# Patient Record
Sex: Male | Born: 1970 | Race: White | Hispanic: No | Marital: Single | Smoking: Never smoker
Health system: Southern US, Community
[De-identification: ages and names within clinical notes are randomized; demographics above are authoritative.]

## PROBLEM LIST (undated history)

## (undated) DIAGNOSIS — M51369 Other intervertebral disc degeneration, lumbar region without mention of lumbar back pain or lower extremity pain: Secondary | ICD-10-CM

## (undated) DIAGNOSIS — F419 Anxiety disorder, unspecified: Secondary | ICD-10-CM

## (undated) DIAGNOSIS — M5136 Other intervertebral disc degeneration, lumbar region: Secondary | ICD-10-CM

## (undated) DIAGNOSIS — D649 Anemia, unspecified: Secondary | ICD-10-CM

## (undated) DIAGNOSIS — M1990 Unspecified osteoarthritis, unspecified site: Secondary | ICD-10-CM

## (undated) DIAGNOSIS — F988 Other specified behavioral and emotional disorders with onset usually occurring in childhood and adolescence: Secondary | ICD-10-CM

## (undated) DIAGNOSIS — K219 Gastro-esophageal reflux disease without esophagitis: Secondary | ICD-10-CM

## (undated) DIAGNOSIS — J45909 Unspecified asthma, uncomplicated: Secondary | ICD-10-CM

## (undated) DIAGNOSIS — T7840XA Allergy, unspecified, initial encounter: Secondary | ICD-10-CM

## (undated) DIAGNOSIS — F32A Depression, unspecified: Secondary | ICD-10-CM

## (undated) HISTORY — DX: Other specified behavioral and emotional disorders with onset usually occurring in childhood and adolescence: F98.8

## (undated) HISTORY — DX: Other intervertebral disc degeneration, lumbar region: M51.36

## (undated) HISTORY — DX: Allergy, unspecified, initial encounter: T78.40XA

## (undated) HISTORY — DX: Anemia, unspecified: D64.9

## (undated) HISTORY — DX: Other intervertebral disc degeneration, lumbar region without mention of lumbar back pain or lower extremity pain: M51.369

## (undated) HISTORY — DX: Unspecified osteoarthritis, unspecified site: M19.90

## (undated) HISTORY — PX: TONSILLECTOMY AND ADENOIDECTOMY: SUR1326

## (undated) HISTORY — DX: Depression, unspecified: F32.A

## (undated) HISTORY — DX: Anxiety disorder, unspecified: F41.9

## (undated) HISTORY — DX: Gastro-esophageal reflux disease without esophagitis: K21.9

## (undated) HISTORY — PX: EYE SURGERY: SHX253

---

## 1998-09-01 HISTORY — PX: LASIK: SHX215

## 2015-06-05 ENCOUNTER — Encounter (INDEPENDENT_AMBULATORY_CARE_PROVIDER_SITE_OTHER): Payer: Self-pay

## 2015-09-26 ENCOUNTER — Encounter: Payer: Self-pay | Admitting: Family Medicine

## 2015-10-23 ENCOUNTER — Encounter: Payer: Self-pay | Admitting: Family Medicine

## 2015-11-19 ENCOUNTER — Ambulatory Visit (INDEPENDENT_AMBULATORY_CARE_PROVIDER_SITE_OTHER): Payer: BC Managed Care – PPO | Admitting: Family Medicine

## 2015-11-19 ENCOUNTER — Encounter: Payer: Self-pay | Admitting: Family Medicine

## 2015-11-19 VITALS — BP 107/63 | HR 69 | Temp 98.6°F | Resp 16 | Ht 68.0 in | Wt 197.7 lb

## 2015-11-19 DIAGNOSIS — R350 Frequency of micturition: Secondary | ICD-10-CM

## 2015-11-19 DIAGNOSIS — Z0000 Encounter for general adult medical examination without abnormal findings: Secondary | ICD-10-CM | POA: Insufficient documentation

## 2015-11-19 LAB — POCT GLYCOSYLATED HEMOGLOBIN (HGB A1C): Hemoglobin A1C: 5.6

## 2015-11-19 NOTE — Progress Notes (Signed)
Name: Harold Forbes   MRN: [id number]    DOB: April 10, 1971   Date:11/19/2015       Progress Note  Subjective  Chief Complaint  Chief Complaint  Patient presents with  . Annual Exam    CPE    HPI  Pt. Is here for a Complete Physical Exam. He reports being in good health, No chronic conditions. Takes Zantac 3-4 times a month for heartburn or acid reflux.    Past Medical History  Diagnosis Date  . ADD (attention deficit disorder)   . GERD (gastroesophageal reflux disease)     Past Surgical History  Procedure Laterality Date  . Lasik Bilateral 2000    Family History  Problem Relation Age of Onset  . Healthy Mother   . Lung cancer Father     Social History   Social History  . Marital Status: Single    Spouse Name: N/A  . Number of Children: N/A  . Years of Education: N/A   Occupational History  . Not on file.   Social History Main Topics  . Smoking status: Never Smoker   . Smokeless tobacco: Never Used  . Alcohol Use: 0.0 oz/week    0 Standard drinks or equivalent per week     Comment: occasional  . Drug Use: No  . Sexual Activity: Yes   Other Topics Concern  . Not on file   Social History Narrative  . No narrative on file     Current outpatient prescriptions:  .  meloxicam (MOBIC) 15 MG tablet, Take 15 mg by mouth as needed for pain., Disp: , Rfl:  .  omeprazole (PRILOSEC) 20 MG capsule, Take 20 mg by mouth as needed., Disp: , Rfl:   No Known Allergies   Review of Systems  Constitutional: Negative for fever, chills, weight loss and malaise/fatigue.  HENT: Negative for ear pain and sore throat.   Eyes: Negative for blurred vision.  Respiratory: Negative for shortness of breath.   Cardiovascular: Negative for chest pain and leg swelling.  Gastrointestinal: Negative for nausea, vomiting, blood in stool and melena.  Genitourinary: Negative for dysuria and hematuria.  Musculoskeletal: Positive for joint pain. Negative for myalgias and back pain.   Skin: Negative for rash.  Neurological: Negative for dizziness and headaches.  Psychiatric/Behavioral: Negative for depression. The patient is not nervous/anxious.     Objective  Filed Vitals:   11/19/15 1409  BP: 107/63  Pulse: 69  Temp: 98.6 F (37 C)  TempSrc: Oral  Resp: 16  Height: 5\' 8"  (1.727 m)  Weight: 197 lb 11.2 oz (89.676 kg)  SpO2: 97%    Physical Exam  Constitutional: He is oriented to person, place, and time and well-developed, well-nourished, and in no distress. Vital signs are normal.  HENT:  Right Ear: No drainage or swelling.  Left Ear: No drainage or swelling.  Mouth/Throat: No posterior oropharyngeal erythema.  R. Ear cerumen impaction. L. Ear normal canal and TM  Eyes: Conjunctivae are normal.  Cardiovascular: Normal rate, regular rhythm, S1 normal, S2 normal and normal heart sounds.   Pulmonary/Chest: Effort normal and breath sounds normal. He has no decreased breath sounds. He has no wheezes.  Abdominal: Soft. Bowel sounds are normal.  Genitourinary: Rectum normal and prostate normal. Prostate is not tender.  Musculoskeletal: He exhibits no edema.       Right ankle: He exhibits no swelling.       Left ankle: He exhibits no swelling.  Neurological: He is alert and oriented to  person, place, and time.  Skin: Skin is warm, dry and intact.  Psychiatric: Mood, memory, affect and judgment normal.  Nursing note and vitals reviewed.      Assessment & Plan  1. Annual physical exam Age appropriate laboratory evaluation obtained. - CBC with Differential - Comprehensive Metabolic Panel (CMET) - TSH - Vitamin D (25 hydroxy)  2. Frequent urination Rule out diabetes as a possible cause of frequent urination - POCT HgB A1C   Harold Forbes [facility] 11/19/2015 2:27 PM

## 2015-11-23 LAB — CBC WITH DIFFERENTIAL/PLATELET
Basophils Absolute: 0 10*3/uL (ref 0.0–0.2)
Basos: 1 %
EOS (ABSOLUTE): 0.6 10*3/uL — ABNORMAL HIGH (ref 0.0–0.4)
EOS: 7 %
HEMATOCRIT: 42.7 % (ref 37.5–51.0)
Hemoglobin: 14.2 g/dL (ref 12.6–17.7)
Immature Grans (Abs): 0 10*3/uL (ref 0.0–0.1)
Immature Granulocytes: 0 %
LYMPHS ABS: 1.7 10*3/uL (ref 0.7–3.1)
Lymphs: 22 %
MCH: 28.2 pg (ref 26.6–33.0)
MCHC: 33.3 g/dL (ref 31.5–35.7)
MCV: 85 fL (ref 79–97)
MONOCYTES: 10 %
Monocytes Absolute: 0.7 10*3/uL (ref 0.1–0.9)
NEUTROS ABS: 4.7 10*3/uL (ref 1.4–7.0)
Neutrophils: 60 %
Platelets: 300 10*3/uL (ref 150–379)
RBC: 5.03 x10E6/uL (ref 4.14–5.80)
RDW: 13.7 % (ref 12.3–15.4)
WBC: 7.7 10*3/uL (ref 3.4–10.8)

## 2015-11-23 LAB — COMPREHENSIVE METABOLIC PANEL
ALK PHOS: 58 IU/L (ref 39–117)
ALT: 37 IU/L (ref 0–44)
AST: 27 IU/L (ref 0–40)
Albumin/Globulin Ratio: 2.1 (ref 1.2–2.2)
Albumin: 4.5 g/dL (ref 3.5–5.5)
BILIRUBIN TOTAL: 0.9 mg/dL (ref 0.0–1.2)
BUN/Creatinine Ratio: 15 (ref 9–20)
BUN: 17 mg/dL (ref 6–24)
CO2: 25 mmol/L (ref 18–29)
Calcium: 9.2 mg/dL (ref 8.7–10.2)
Chloride: 101 mmol/L (ref 96–106)
Creatinine, Ser: 1.11 mg/dL (ref 0.76–1.27)
GFR calc Af Amer: 92 mL/min/{1.73_m2} (ref >59)
GFR, EST NON AFRICAN AMERICAN: 79 mL/min/{1.73_m2} (ref >59)
GLOBULIN, TOTAL: 2.1 g/dL (ref 1.5–4.5)
Glucose: 110 mg/dL — ABNORMAL HIGH (ref 65–99)
POTASSIUM: 4.6 mmol/L (ref 3.5–5.2)
Sodium: 139 mmol/L (ref 134–144)
Total Protein: 6.6 g/dL (ref 6.0–8.5)

## 2015-11-23 LAB — TSH: TSH: 1.46 u[IU]/mL (ref 0.450–4.500)

## 2015-11-23 LAB — VITAMIN D 25 HYDROXY (VIT D DEFICIENCY, FRACTURES): VIT D 25 HYDROXY: 34.1 ng/mL (ref 30.0–100.0)

## 2016-09-15 ENCOUNTER — Encounter: Payer: BC Managed Care – PPO | Admitting: Family Medicine

## 2016-11-12 ENCOUNTER — Encounter: Payer: BC Managed Care – PPO | Admitting: Family Medicine

## 2016-11-19 ENCOUNTER — Encounter: Payer: Self-pay | Admitting: Family Medicine

## 2016-11-19 ENCOUNTER — Ambulatory Visit (INDEPENDENT_AMBULATORY_CARE_PROVIDER_SITE_OTHER): Admitting: Family Medicine

## 2016-11-19 VITALS — BP 118/76 | HR 73 | Temp 98.2°F | Resp 16 | Ht 68.0 in | Wt 193.0 lb

## 2016-11-19 DIAGNOSIS — M778 Other enthesopathies, not elsewhere classified: Secondary | ICD-10-CM | POA: Diagnosis not present

## 2016-11-19 DIAGNOSIS — Z0000 Encounter for general adult medical examination without abnormal findings: Secondary | ICD-10-CM | POA: Diagnosis not present

## 2016-11-19 DIAGNOSIS — Z113 Encounter for screening for infections with a predominantly sexual mode of transmission: Secondary | ICD-10-CM

## 2016-11-19 LAB — COMPLETE METABOLIC PANEL WITH GFR
ALT: 27 U/L (ref 9–46)
AST: 31 U/L (ref 10–40)
Albumin: 4.3 g/dL (ref 3.6–5.1)
Alkaline Phosphatase: 51 U/L (ref 40–115)
BUN: 15 mg/dL (ref 7–25)
CO2: 26 mmol/L (ref 20–31)
Calcium: 9.2 mg/dL (ref 8.6–10.3)
Chloride: 103 mmol/L (ref 98–110)
Creat: 1.28 mg/dL (ref 0.60–1.35)
GFR, EST NON AFRICAN AMERICAN: 66 mL/min (ref >=60)
GFR, Est African American: 77 mL/min (ref >=60)
GLUCOSE: 89 mg/dL (ref 65–99)
POTASSIUM: 4.2 mmol/L (ref 3.5–5.3)
SODIUM: 139 mmol/L (ref 135–146)
TOTAL PROTEIN: 6.6 g/dL (ref 6.1–8.1)
Total Bilirubin: 1.4 mg/dL — ABNORMAL HIGH (ref 0.2–1.2)

## 2016-11-19 LAB — CBC WITH DIFFERENTIAL/PLATELET
BASOS ABS: 110 {cells}/uL (ref 0–200)
Basophils Relative: 1 %
EOS ABS: 550 {cells}/uL — AB (ref 15–500)
EOS PCT: 5 %
HCT: 43.9 % (ref 38.5–50.0)
Hemoglobin: 14.7 g/dL (ref 13.2–17.1)
LYMPHS ABS: 1650 {cells}/uL (ref 850–3900)
Lymphocytes Relative: 15 %
MCH: 28.8 pg (ref 27.0–33.0)
MCHC: 33.5 g/dL (ref 32.0–36.0)
MCV: 86.1 fL (ref 80.0–100.0)
MPV: 10.5 fL (ref 7.5–12.5)
Monocytes Absolute: 660 cells/uL (ref 200–950)
Monocytes Relative: 6 %
NEUTROS ABS: 8030 {cells}/uL — AB (ref 1500–7800)
NEUTROS PCT: 73 %
PLATELETS: 288 10*3/uL (ref 140–400)
RBC: 5.1 MIL/uL (ref 4.20–5.80)
RDW: 13.5 % (ref 11.0–15.0)
WBC: 11 10*3/uL — ABNORMAL HIGH (ref 3.8–10.8)

## 2016-11-19 LAB — LIPID PANEL
CHOL/HDL RATIO: 3.1 ratio (ref <5.0)
Cholesterol: 143 mg/dL (ref <200)
HDL: 46 mg/dL (ref >40)
LDL CALC: 80 mg/dL (ref <100)
Triglycerides: 83 mg/dL (ref <150)
VLDL: 17 mg/dL (ref <30)

## 2016-11-19 LAB — TSH: TSH: 0.9 mIU/L (ref 0.40–4.50)

## 2016-11-19 LAB — PSA: PSA: 0.4 ng/mL (ref <=4.0)

## 2016-11-19 MED ORDER — MELOXICAM 15 MG PO TABS: 15.0000 mg | ORAL_TABLET | ORAL | 2 refills | Status: DC | PRN

## 2016-11-19 NOTE — Progress Notes (Signed)
Name: Harold Forbes   MRN: [id number]    DOB: 22-Jul-1971   Date:11/19/2016       Progress Note  Subjective  Chief Complaint  Chief Complaint  Patient presents with  . Annual Exam    HPI  Pt. Presents for Annual Physical Exam.   Past Medical History:  Diagnosis Date  . ADD (attention deficit disorder)   . GERD (gastroesophageal reflux disease)     Past Surgical History:  Procedure Laterality Date  . LASIK Bilateral 2000    Family History  Problem Relation Age of Onset  . Healthy Mother   . Lung cancer Father     Social History   Social History  . Marital status: Single    Spouse name: N/A  . Number of children: N/A  . Years of education: N/A   Occupational History  . Not on file.   Social History Main Topics  . Smoking status: Never Smoker  . Smokeless tobacco: Never Used  . Alcohol use 0.0 oz/week     Comment: occasional  . Drug use: No  . Sexual activity: Yes   Other Topics Concern  . Not on file   Social History Narrative  . No narrative on file     Current Outpatient Prescriptions:  .  meloxicam (MOBIC) 15 MG tablet, Take 15 mg by mouth as needed for pain., Disp: , Rfl:  .  ranitidine (ZANTAC) 150 MG tablet, Take by mouth., Disp: , Rfl:   No Known Allergies   Review of Systems  Constitutional: Negative for chills, fever, malaise/fatigue and weight loss.  HENT: Negative for congestion, ear pain and sore throat.   Eyes: Negative for blurred vision and double vision.  Respiratory: Negative for cough, shortness of breath and stridor.   Cardiovascular: Negative for chest pain and leg swelling.  Gastrointestinal: Positive for heartburn (occasional heartburn). Negative for abdominal pain, blood in stool, nausea and vomiting.  Genitourinary: Negative for hematuria.  Musculoskeletal: Negative for back pain (occasionally) and neck pain.  Neurological: Negative for dizziness and headaches.  Psychiatric/Behavioral: Negative for depression. The  patient is not nervous/anxious and does not have insomnia.     Objective  Vitals:   11/19/16 1433  BP: 118/76  Pulse: 73  Resp: 16  Temp: 98.2 F (36.8 C)  TempSrc: Oral  SpO2: 96%  Weight: 193 lb (87.5 kg)  Height: 5\' 8"  (1.727 m)    Physical Exam  Constitutional: He is oriented to person, place, and time and well-developed, well-nourished, and in no distress.  HENT:  Head: Normocephalic and atraumatic.  Eyes: Pupils are equal, round, and reactive to light.  Neck: Normal range of motion. Neck supple.  Cardiovascular: Normal rate, regular rhythm and normal heart sounds.   No murmur heard. Pulmonary/Chest: Effort normal and breath sounds normal. He has no wheezes.  Abdominal: Soft. Bowel sounds are normal. There is no tenderness. There is no rebound.  Genitourinary: Prostate is not enlarged and not tender.  Musculoskeletal: He exhibits no edema.  Neurological: He is alert and oriented to person, place, and time.  Psychiatric: Mood, memory, affect and judgment normal.  Nursing note and vitals reviewed.       Assessment & Plan  1. Annual physical exam Obtain age-appropriate laboratory workup - CBC with Differential/Platelet - COMPLETE METABOLIC PANEL WITH GFR - Lipid panel - TSH - VITAMIN D 25 Hydroxy (Vit-D Deficiency, Fractures) - PSA  2. Screening for STDs (sexually transmitted diseases) He has no concerning symptoms and does not believe  that he had been exposed to STDs, wants to screen as part of his annual physical. We'll order appropriate laboratory testing - GC/Chlamydia Probe Amp - RPR - HIV antibody (with reflex) - HSV(herpes smplx)abs-1+2(IgG+IgM)-bld  3. Right elbow tendonitis [place] with strenuous activity involving the elbow, uses meloxicam as needed, refills provided - meloxicam (MOBIC) 15 MG tablet; Take 1 tablet (15 mg total) by mouth as needed for pain.  Dispense: 30 tablet; Refill: 2  Willadean Guyton [facility] 11/19/2016 2:44 PM

## 2016-11-20 LAB — RPR

## 2016-11-20 LAB — VITAMIN D 25 HYDROXY (VIT D DEFICIENCY, FRACTURES): VIT D 25 HYDROXY: 33 ng/mL (ref 30–100)

## 2016-11-20 LAB — GC/CHLAMYDIA PROBE AMP
CT PROBE, AMP APTIMA: NOT DETECTED
GC PROBE AMP APTIMA: NOT DETECTED

## 2016-11-22 LAB — HSV 1/2 AB (IGM), IFA W/RFLX TITER
HSV 1 IgM Screen: NEGATIVE
HSV 2 IgM Screen: NEGATIVE

## 2016-11-22 LAB — HIV ANTIBODY (ROUTINE TESTING W REFLEX): HIV: NONREACTIVE

## 2016-11-24 ENCOUNTER — Encounter: Payer: BC Managed Care – PPO | Admitting: Family Medicine

## 2016-12-05 ENCOUNTER — Ambulatory Visit
Admission: RE | Admit: 2016-12-05 | Discharge: 2016-12-05 | Disposition: A | Discharge status: Home / Self Care | Source: Ambulatory Visit | Attending: Family Medicine | Admitting: Family Medicine

## 2016-12-05 ENCOUNTER — Encounter: Payer: Self-pay | Admitting: Family Medicine

## 2016-12-05 ENCOUNTER — Ambulatory Visit (INDEPENDENT_AMBULATORY_CARE_PROVIDER_SITE_OTHER): Admitting: Family Medicine

## 2016-12-05 VITALS — BP 116/71 | HR 87 | Temp 99.2°F | Resp 16 | Ht 68.0 in | Wt 189.0 lb

## 2016-12-05 DIAGNOSIS — R109 Unspecified abdominal pain: Secondary | ICD-10-CM | POA: Diagnosis not present

## 2016-12-05 DIAGNOSIS — R319 Hematuria, unspecified: Secondary | ICD-10-CM | POA: Insufficient documentation

## 2016-12-05 HISTORY — DX: Unspecified asthma, uncomplicated: J45.909

## 2016-12-05 LAB — POCT URINALYSIS DIPSTICK
Bilirubin, UA: NEGATIVE
Glucose, UA: NEGATIVE
Ketones, UA: NEGATIVE
Leukocytes, UA: NEGATIVE
Nitrite, UA: POSITIVE
Protein, UA: 100
Spec Grav, UA: 1.02 (ref 1.030–1.035)
Urobilinogen, UA: 0.2 (ref ?–2.0)
pH, UA: 5 (ref 5.0–8.0)

## 2016-12-05 MED ORDER — CIPROFLOXACIN HCL 500 MG PO TABS: 500.0000 mg | ORAL_TABLET | Freq: Two times a day (BID) | ORAL | 0 refills | Status: AC

## 2016-12-05 MED ORDER — IOPAMIDOL (ISOVUE-300) INJECTION 61%
100.0000 mL | Freq: Once | INTRAVENOUS | Status: AC | PRN
  Administered 2016-12-05: 100 mL via INTRAVENOUS

## 2016-12-05 MED ORDER — TAMSULOSIN HCL 0.4 MG PO CAPS: 0.4000 mg | ORAL_CAPSULE | Freq: Every day | ORAL | 0 refills | Status: DC

## 2016-12-05 NOTE — Progress Notes (Signed)
Name: Harold Forbes   MRN: [id number]    DOB: 31-Oct-1970   Date:12/05/2016       Progress Note  Subjective  Chief Complaint  Chief Complaint  Patient presents with  . Hematuria    Hematuria  This is a new problem. The current episode started in the past 7 days (2 days ago). He describes the hematuria as gross hematuria. The hematuria occurs throughout @ entire urinary stream.  He reports clotting at the end of his urine stream. The pain is moderate. He describes his urine color as yellow (now clear urine with hints of red in it). Obstructive symptoms include dribbling. Obstructive symptoms do not include a slower stream or a weak stream. Associated symptoms include flank pain. There is no history of kidney stones.     Past Medical History:  Diagnosis Date  . ADD (attention deficit disorder)   . Asthma   . GERD (gastroesophageal reflux disease)     Past Surgical History:  Procedure Laterality Date  . LASIK Bilateral 2000    Family History  Problem Relation Age of Onset  . Healthy Mother   . Lung cancer Father     Social History   Social History  . Marital status: Single    Spouse name: N/A  . Number of children: N/A  . Years of education: N/A   Occupational History  . Not on file.   Social History Main Topics  . Smoking status: Never Smoker  . Smokeless tobacco: Never Used  . Alcohol use 0.0 oz/week     Comment: occasional  . Drug use: No  . Sexual activity: Yes   Other Topics Concern  . Not on file   Social History Narrative  . No narrative on file     Current Outpatient Prescriptions:  .  meloxicam (MOBIC) 15 MG tablet, Take 1 tablet (15 mg total) by mouth as needed for pain., Disp: 30 tablet, Rfl: 2 .  ranitidine (ZANTAC) 150 MG tablet, Take by mouth., Disp: , Rfl:  .  ciprofloxacin (CIPRO) 500 MG tablet, Take 1 tablet (500 mg total) by mouth 2 (two) times daily., Disp: 14 tablet, Rfl: 0 .  tamsulosin (FLOMAX) 0.4 MG CAPS capsule, Take 1 capsule  (0.4 mg total) by mouth daily after supper., Disp: 30 capsule, Rfl: 0  No Known Allergies   Review of Systems  Genitourinary: Positive for flank pain and hematuria.    Please see history of present illness for complete discussion of ROS  Objective  Vitals:   12/05/16 1432  BP: 116/71  Pulse: 87  Resp: 16  Temp: 99.2 F (37.3 C)  TempSrc: Oral  SpO2: 99%  Weight: 189 lb (85.7 kg)  Height:  (1.727 m)    Physical Exam  Constitutional: He is oriented to person, place, and time and well-developed, well-nourished, and in no distress.  Cardiovascular: Normal rate, regular rhythm and normal heart sounds.   No murmur heard. Pulmonary/Chest: Effort normal and breath sounds normal. He has no wheezes.  Abdominal: Soft. Bowel sounds are normal. There is no tenderness. There is no CVA tenderness.  Genitourinary: Rectum normal and prostate normal. Prostate is not enlarged and not tender.  Neurological: He is alert and oriented to person, place, and time.  Skin: Skin is warm, dry and intact.  Psychiatric: Mood, memory, affect and judgment normal.  Nursing note and vitals reviewed.      Assessment & Plan  1. Hematuria, unspecified type Suspect kidney stones, obtain CT abdomen and  pelvis with and without contrast for confirmation. - POCT Urinalysis Dipstick - CT ABDOMEN PELVIS W WO CONTRAST; Future  2. Acute left flank pain Would add ciprofloxacin to cover for possible infection given that his nitrites are positive, start on Flomax 0.4 mg to be taken for possible kidney stone, urinalysis and culture to be ordered. - ciprofloxacin (CIPRO) 500 MG tablet; Take 1 tablet (500 mg total) by mouth 2 (two) times daily.  Dispense: 14 tablet; Refill: 0 - tamsulosin (FLOMAX) 0.4 MG CAPS capsule; Take 1 capsule (0.4 mg total) by mouth daily after supper.  Dispense: 30 capsule; Refill: 0 - Urinalysis, Routine w reflex microscopic - Urine Culture   Harold Forbes [facility] 12/05/2016 5:43 PM

## 2016-12-06 LAB — URINALYSIS, MICROSCOPIC ONLY
CRYSTALS: NONE SEEN [HPF]
Casts: NONE SEEN [LPF]
Yeast: NONE SEEN [HPF]

## 2016-12-06 LAB — URINALYSIS, ROUTINE W REFLEX MICROSCOPIC
Bilirubin Urine: NEGATIVE
Glucose, UA: NEGATIVE
Nitrite: POSITIVE — AB
SPECIFIC GRAVITY, URINE: 1.026 (ref 1.001–1.035)
pH: 6 (ref 5.0–8.0)

## 2016-12-08 LAB — URINE CULTURE

## 2016-12-11 ENCOUNTER — Ambulatory Visit: Payer: Self-pay

## 2017-08-02 IMAGING — CT CT ABD-PEL WO/W CM
3 of 9 series · 12 of 46 positions shown, 18 images · IV contrast (iopamidol)
Comparison: None.

CLINICAL DATA: Left flank pain and hematuria for 2 days.

EXAM:
CT ABDOMEN AND PELVIS WITHOUT AND WITH CONTRAST
TECHNIQUE: Multidetector CT imaging of the abdomen and pelvis was performed
following the standard protocol before and following the bolus
administration of intravenous contrast.
CONTRAST:  100mL PPYN89-PQQ IOPAMIDOL (PPYN89-PQQ) INJECTION 61%

[Series 2: abd/pel pre · axial · non-contrast · 0.76mm/px · z∈[-471,-111]mm · 7 of 96 slices shown, 12 images]
[im 12/96  soft-tissue]
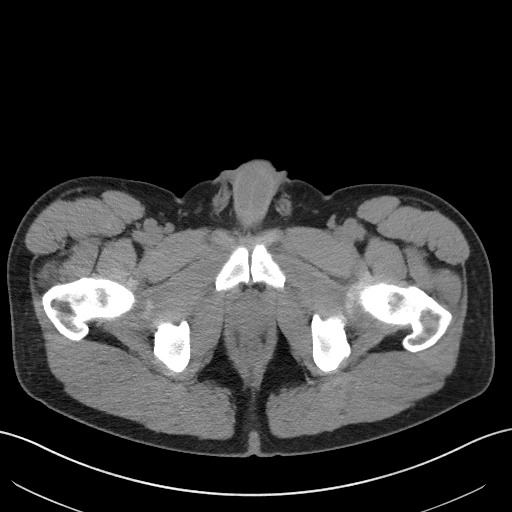
[im 12/96  bone]
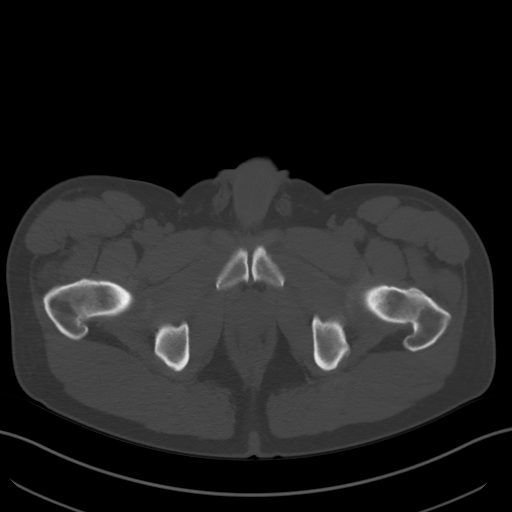
[im 24/96  soft-tissue]
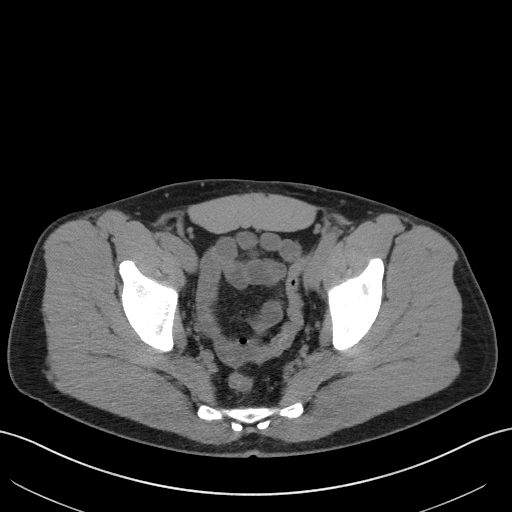
[im 36/96  soft-tissue]
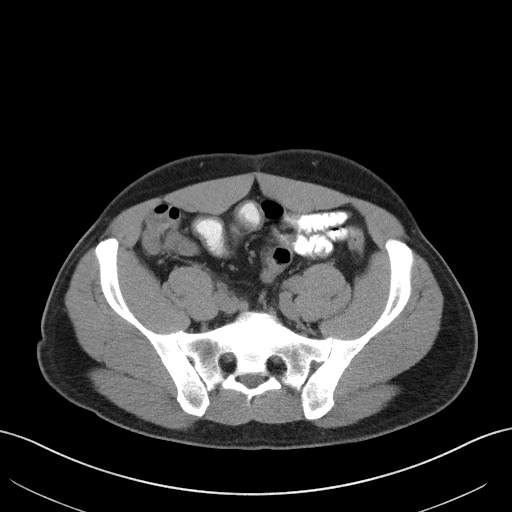
[im 48/96  soft-tissue]
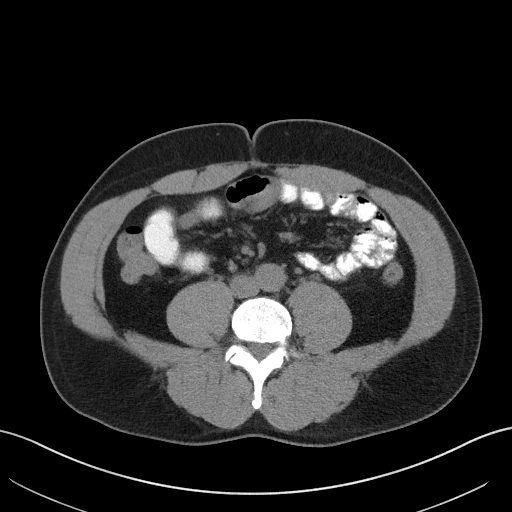
[im 48/96  lung]
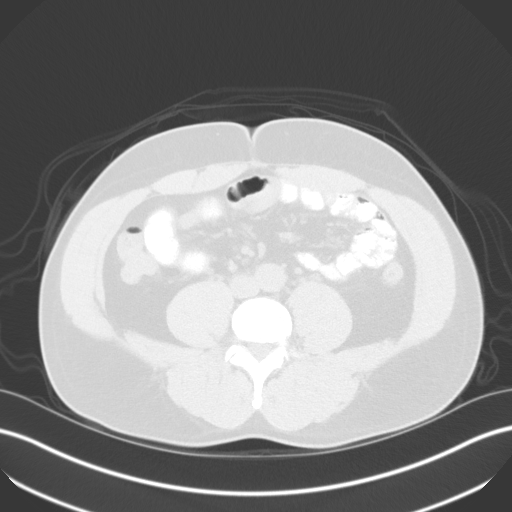
[im 60/96  soft-tissue]
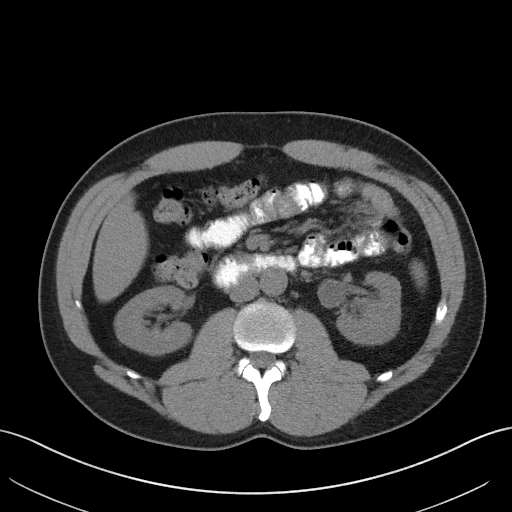
[im 60/96  lung]
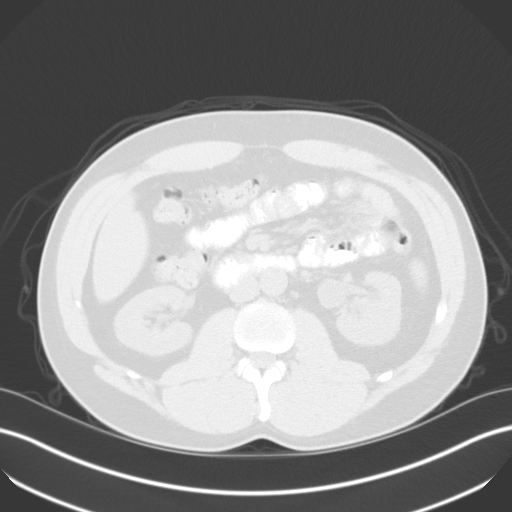
[im 72/96  soft-tissue]
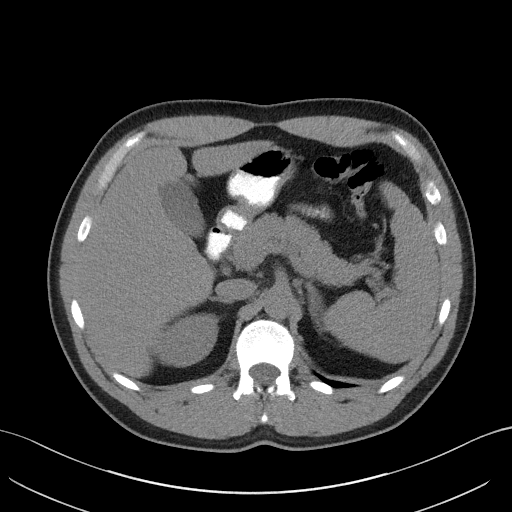
[im 72/96  lung]
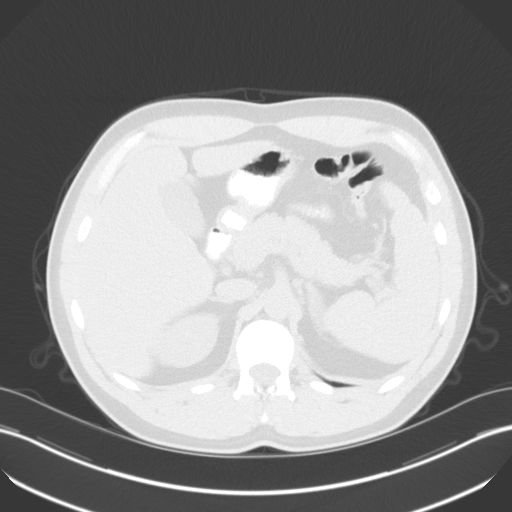
[im 84/96  soft-tissue]
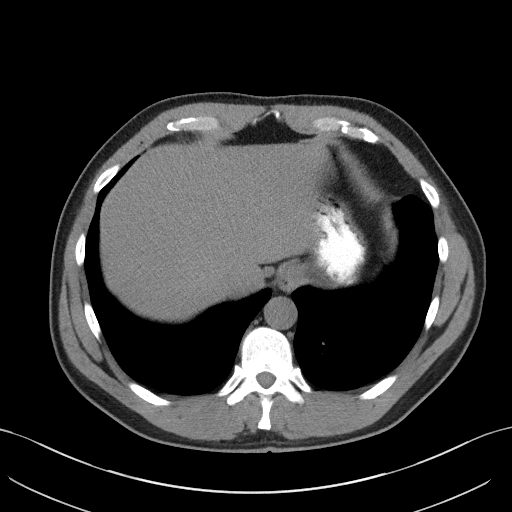
[im 84/96  lung]
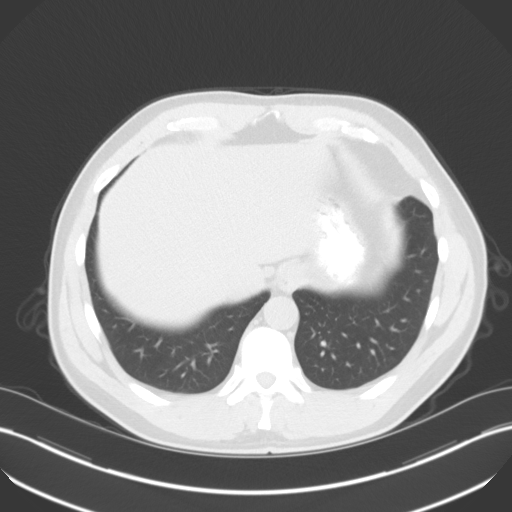

[Series 5: coronal st · coronal · 0.78mm/px · 3 of 92 slices shown, 4 images]
[im 23/92  soft-tissue]
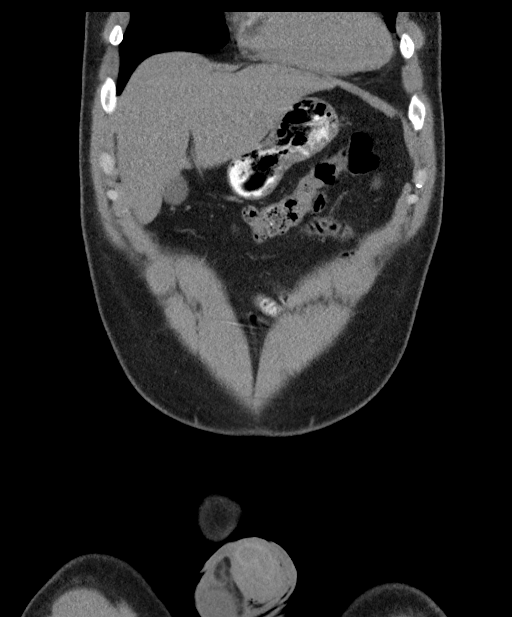
[im 46/92  soft-tissue]
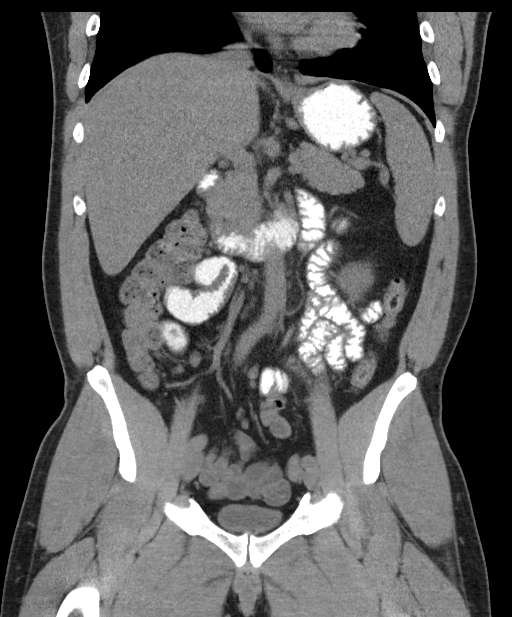
[im 46/92  bone]
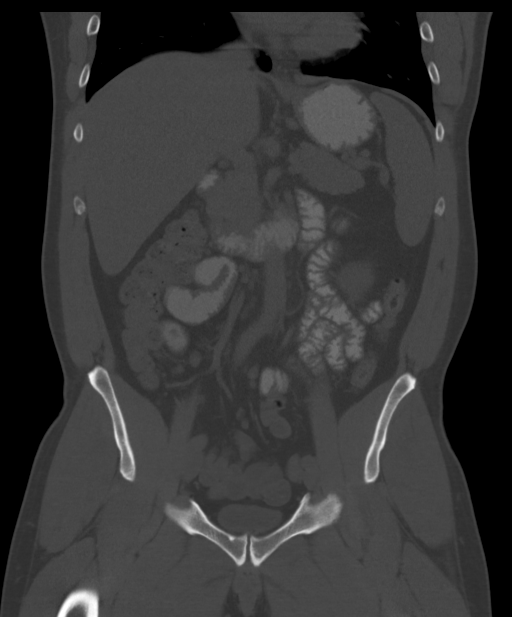
[im 69/92  soft-tissue]
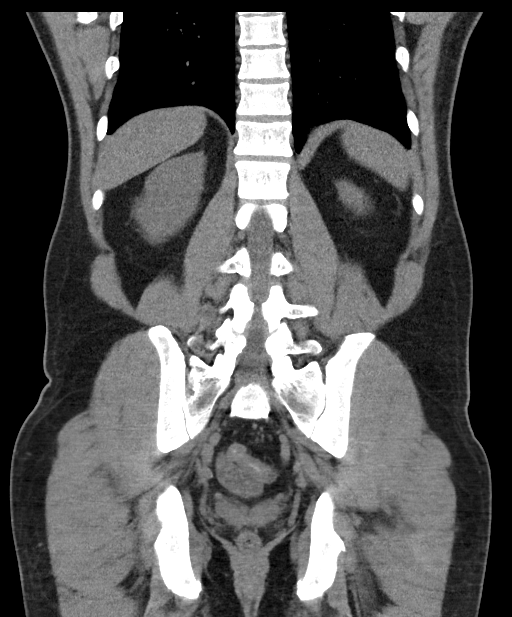

[Series 7: abd/pel post · axial · 0.76mm/px · z∈[-471,-411]mm · 2 of 96 slices shown]
[im 12/96  soft-tissue]
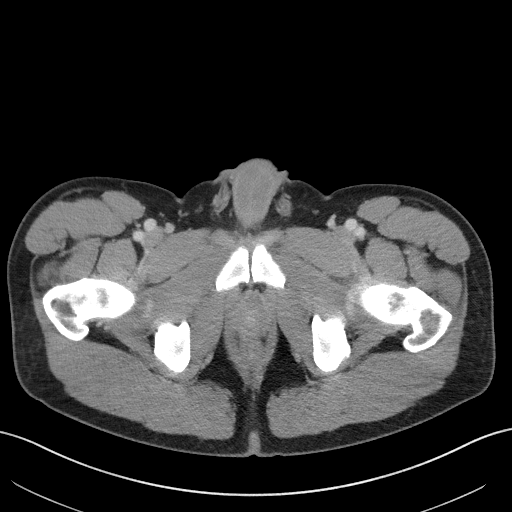
[im 24/96  soft-tissue]
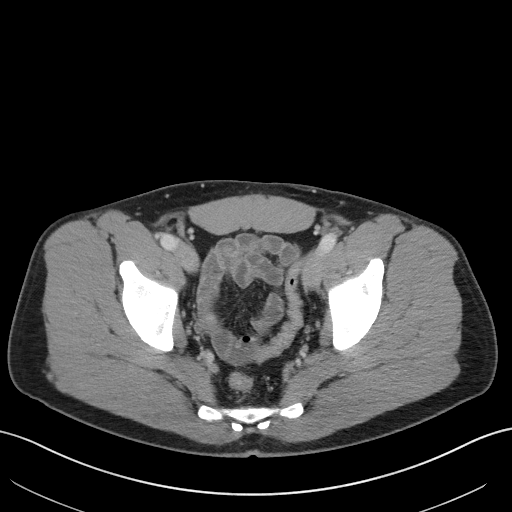

[12 of 46 positions shown; findings below may reference images not displayed]

FINDINGS: Lower chest: 0 The lung bases are clear of acute process. No pleural
effusion or pulmonary lesions. The heart is normal in size. No
pericardial effusion. The distal esophagus and aorta are
unremarkable.

Hepatobiliary: No focal hepatic lesions or intrahepatic biliary
dilatation. The gallbladder is normal. No common bile duct
dilatation.

Pancreas: No mass, inflammation or ductal dilatation.

Spleen: Normal size.  No focal lesions.

Adrenals/Urinary Tract: The adrenal glands are normal.

No renal, ureteral or bladder calculi or mass. Extrarenal pelvis
noted on the left. Duplicated collecting system noted on the right.

Stomach/Bowel: The stomach, duodenum, small bowel and colon are
unremarkable. No inflammatory changes, mass lesions or obstructive
findings. The terminal ileum is normal.

Vascular/Lymphatic: The aorta is normal in caliber. No dissection.
The branch vessels are patent. The major venous structures are
patent. No mesenteric or retroperitoneal mass or adenopathy. Small
scattered lymph nodes are noted.

Reproductive: The prostate gland and seminal vesicles are
unremarkable.

Other: No pelvic mass or adenopathy. No free pelvic fluid
collections. No inguinal mass or adenopathy. No abdominal wall
hernia or subcutaneous lesions.

Musculoskeletal: No significant bony findings.
IMPRESSION: No acute abdominal/ pelvic findings, mass lesions or
lymphadenopathy.

No renal, ureteral or bladder calculi or mass.

## 2017-12-08 ENCOUNTER — Encounter: Admitting: Family Medicine

## 2018-01-28 ENCOUNTER — Encounter: Admitting: Family Medicine

## 2018-01-29 ENCOUNTER — Ambulatory Visit (INDEPENDENT_AMBULATORY_CARE_PROVIDER_SITE_OTHER): Admitting: Family Medicine

## 2018-01-29 ENCOUNTER — Encounter: Payer: Self-pay | Admitting: Family Medicine

## 2018-01-29 VITALS — BP 116/78 | HR 85 | Temp 98.3°F | Resp 12 | Ht 68.0 in | Wt 195.8 lb

## 2018-01-29 DIAGNOSIS — M778 Other enthesopathies, not elsewhere classified: Secondary | ICD-10-CM

## 2018-01-29 DIAGNOSIS — K219 Gastro-esophageal reflux disease without esophagitis: Secondary | ICD-10-CM | POA: Insufficient documentation

## 2018-01-29 DIAGNOSIS — R0683 Snoring: Secondary | ICD-10-CM

## 2018-01-29 DIAGNOSIS — Z0000 Encounter for general adult medical examination without abnormal findings: Secondary | ICD-10-CM | POA: Diagnosis not present

## 2018-01-29 MED ORDER — RANITIDINE HCL 150 MG PO TABS: 150.0000 mg | ORAL_TABLET | Freq: Every day | ORAL | 3 refills | Status: DC | PRN

## 2018-01-29 MED ORDER — MELOXICAM 15 MG PO TABS: 15.0000 mg | ORAL_TABLET | Freq: Every day | ORAL | 3 refills | Status: DC | PRN

## 2018-01-29 NOTE — Progress Notes (Signed)
BP 116/78   Pulse 85   Temp 98.3 F (36.8 C) (Oral)   Resp 12   Ht 5\' 8"  (1.727 m)   Wt 195 lb 12.8 oz (88.8 kg)   SpO2 95%   BMI 29.77 kg/m    Subjective:    Patient ID: Harold Forbes, male    DOB: Nov 07, 1970, 47 y.o.   MRN: [id number]  HPI: Harold Forbes is a 47 y.o. male  Chief Complaint  Patient presents with  . Follow-up  . Medication Refill  . Gastroesophageal Reflux    HPI Patient is new to me; previous provider left our practice  GERD; bothered with that for a few years; triggers are acid foods, caffeine, tomatoes, spices, bananas; onions Okay with green and red peppers; avoids hot peppers No blood in the stool and no abd pain Father may have had a stomach ulcer  He takes the meloxicam; he works out and uses that for tendonitis; can feel things getting pulled when working; free weights; not an every day  Mildly elevated bilirubin, no symptoms at all  Allergies; sneezing at times; itchy watery eyes; sometimes  USPSTF grade A and B recommendations Depression:  Depression screen [facility] 2/9 01/29/2018 12/05/2016 11/19/2015  Decreased Interest 0 0 0  Down, Depressed, Hopeless 1 0 0  PHQ - 2 Score 1 0 0  Altered sleeping 1 - -  Tired, decreased energy 0 - -  Change in appetite 0 - -  Feeling bad or failure about yourself  0 - -  Trouble concentrating 0 - -  Moving slowly or fidgety/restless 0 - -  Suicidal thoughts 0 - -  PHQ-9 Score 2 - -  Difficult doing work/chores Not difficult at all - -   Hypertension: BP Readings from Last 3 Encounters:  01/29/18 116/78  12/05/16 116/71  11/19/16 118/76   Obesity: Wt Readings from Last 3 Encounters:  01/29/18 195 lb 12.8 oz (88.8 kg)  12/05/16 189 lb (85.7 kg)  11/19/16 193 lb (87.5 kg)   BMI Readings from Last 3 Encounters:  01/29/18 29.77 kg/m  12/05/16 28.74 kg/m  11/19/16 29.35 kg/m    Immunizations:  Skin cancer: nothing worrisome Lung cancer:  nonsmoker Prostate cancer: one uncle; getting up  at night to drink; sweeteners in ice will do it; no obstructive symptoms; does not want sleep apnea testing; does snore; stops breathing when having acid reflux; wakes himself up choking; heavy snorer on his back Lab Results  Component Value Date   PSA 0.4 02/03/2018   PSA 0.4 11/19/2016  Colorectal cancer: no one in the family; start at 50 years AAA: n/a Aspirin: yes, his choice Diet: mostly good eater; tries to eat healthy food, 3-5 veggies a day, 3-5 fruits Exercise: active Alcohol: social Tobacco use: no HIV, hep B, hep C: test STD testing and prevention (chl/gon/syphilis): test Glucose:  Glucose  Date Value Ref Range Status  11/22/2015 110 (H) 65 - 99 mg/dL Final   Glucose, Bld  Date Value Ref Range Status  02/03/2018 92 65 - 99 mg/dL Final    Comment:    .            Fasting reference interval .   11/19/2016 89 65 - 99 mg/dL Final   Lipids:  Lab Results  Component Value Date   CHOL 181 02/03/2018   CHOL 143 11/19/2016   Lab Results  Component Value Date   HDL 48 02/03/2018   HDL 46 11/19/2016   Lab Results  Component Value Date   LDLCALC 114 (H) 02/03/2018   LDLCALC 80 11/19/2016   Lab Results  Component Value Date   TRIG 89 02/03/2018   TRIG 83 11/19/2016   Lab Results  Component Value Date   CHOLHDL 3.8 02/03/2018   CHOLHDL 3.1 11/19/2016   No results found for: LDLDIRECT     Depression screen [facility] 2/9 01/29/2018 12/05/2016 11/19/2015  Decreased Interest 0 0 0  Down, Depressed, Hopeless 1 0 0  PHQ - 2 Score 1 0 0  Altered sleeping 1 - -  Tired, decreased energy 0 - -  Change in appetite 0 - -  Feeling bad or failure about yourself  0 - -  Trouble concentrating 0 - -  Moving slowly or fidgety/restless 0 - -  Suicidal thoughts 0 - -  PHQ-9 Score 2 - -  Difficult doing work/chores Not difficult at all - -  stressed, but not depressed  Relevant past medical, surgical, family and social history reviewed Past Medical History:  Diagnosis Date    . ADD (attention deficit disorder)   . Asthma   . GERD (gastroesophageal reflux disease)   MD note: not sure if he truly has ADD, lots to do, maybe sidetracked  Past Surgical History:  Procedure Laterality Date  . LASIK Bilateral 2000  . TONSILLECTOMY AND ADENOIDECTOMY     Family History  Problem Relation Age of Onset  . Healthy Mother   . Lung cancer Father   . Cancer Father 63       lung   Social History   Tobacco Use  . Smoking status: Never Smoker  . Smokeless tobacco: Never Used  Substance Use Topics  . Alcohol use: Yes    Alcohol/week: 0.0 oz    Comment: occasional  . Drug use: No   Interim medical history since last visit reviewed. Allergies and medications reviewed  Review of Systems  Constitutional: Negative for fever and unexpected weight change (taking protein supplement).  Respiratory: Positive for wheezing (occasional; asthma as a child; type of asthma that improved with age and after tonsillectomy; no more than 2x a week).   Cardiovascular: Negative for chest pain.  Gastrointestinal: Negative for blood in stool.  Endocrine: Negative for polydipsia.  Genitourinary: Negative for hematuria.  Musculoskeletal: Positive for arthralgias.  Skin:       No worrisome  Allergic/Immunologic: Negative for food allergies.  Neurological: Negative for tremors.  Psychiatric/Behavioral: Negative for dysphoric mood.   Per HPI unless specifically indicated above     Objective:    BP 116/78   Pulse 85   Temp 98.3 F (36.8 C) (Oral)   Resp 12   Ht 5\' 8"  (1.727 m)   Wt 195 lb 12.8 oz (88.8 kg)   SpO2 95%   BMI 29.77 kg/m   Wt Readings from Last 3 Encounters:  01/29/18 195 lb 12.8 oz (88.8 kg)  12/05/16 189 lb (85.7 kg)  11/19/16 193 lb (87.5 kg)    Physical Exam  Constitutional: He appears well-developed and well-nourished. No distress.  HENT:  Head: Normocephalic and atraumatic.  Nose: Nose normal.  Mouth/Throat: Oropharynx is clear and moist.  Eyes:  EOM are normal. No scleral icterus.  Neck: No JVD present. No thyromegaly present.  Cardiovascular: Normal rate, regular rhythm and normal heart sounds.  Pulmonary/Chest: Effort normal and breath sounds normal. No respiratory distress. He has no wheezes. He has no rales.  Abdominal: Soft. Bowel sounds are normal. He exhibits no distension. There is no  tenderness. There is no guarding.  Musculoskeletal: Normal range of motion. He exhibits no edema.  Lymphadenopathy:    He has no cervical adenopathy.  Neurological: He is alert. He displays normal reflexes. He exhibits normal muscle tone. Coordination normal.  Skin: Skin is warm and dry. No rash noted. He is not diaphoretic. No erythema. No pallor.  Psychiatric: He has a normal mood and affect. His behavior is normal. Judgment and thought content normal.      Assessment & Plan:   Problem List Items Addressed This Visit      Digestive   GERD (gastroesophageal reflux disease)    He is aware of triggers; careful with aspirin, NSAIDs, bleeding risk; continue H2 blocker      Relevant Medications   ranitidine (ZANTAC) 150 MG tablet     Musculoskeletal and Integument   Right elbow tendonitis   Relevant Medications   meloxicam (MOBIC) 15 MG tablet     Other   Preventative health care - Primary    USPSTF grade A and B recommendations reviewed with patient; age-appropriate recommendations, preventive care, screening tests, etc discussed and encouraged; healthy living encouraged; see AVS for patient education given to patient       Annual physical exam    USPSTF grade A and B recommendations reviewed with patient; age-appropriate recommendations, preventive care, screening tests, etc discussed and encouraged; healthy living encouraged; see AVS for patient education given to patient        Other Visit Diagnoses    Snoring       concern for possible OSA; refer to pulm   Relevant Orders   Ambulatory referral to Pulmonology        Follow up plan: Return in about 15 months (around 04/25/2019) for complete physical.  An after-visit summary was printed and given to the patient at check-out.  Please see the patient instructions which may contain other information and recommendations beyond what is mentioned above in the assessment and plan.  Meds ordered this encounter  Medications  . ranitidine (ZANTAC) 150 MG tablet    Sig: Take 1 tablet (150 mg total) by mouth daily as needed.    Dispense:  90 tablet    Refill:  3    Okay to fill early prior to trip if needed  . meloxicam (MOBIC) 15 MG tablet    Sig: Take 1 tablet (15 mg total) by mouth daily as needed for pain. Caution: prolonged use can be detrimental to the kidneys    Dispense:  90 tablet    Refill:  3    Orders Placed This Encounter  Procedures  . Ambulatory referral to Pulmonology

## 2018-01-29 NOTE — Assessment & Plan Note (Signed)
He is aware of triggers; careful with aspirin, NSAIDs, bleeding risk; continue H2 blocker

## 2018-01-29 NOTE — Patient Instructions (Addendum)
Return on another day fasting for labs If you have not heard anything from my staff in a week about any orders/referrals/studies from today, please contact us here to follow-up [phone] We'll have you see the specialist about possible sleep apnea Caution: prolonged use of proton pump inhibitors like omeprazole (Prilosec), pantoprazole (Protonix), esomeprazole (Nexium), and others like Dexilant and Aciphex may increase your risk of pneumonia, Clostridium difficile colitis, osteoporosis, anemia and other health complications Try to limit or avoid triggers like coffee, caffeinated beverages, onions, chocolate, spicy foods, peppermint, acidic foods like pizza, spaghetti sauce, and orange juice Lose weight if you are overweight or obese Try elevating the head of your bed by placing a small wedge between your mattress and box springs to keep acid in the stomach at night instead of coming up into your esophagus  Health Maintenance, Male A healthy lifestyle and preventive care is important for your health and wellness. Ask your health care provider about what schedule of regular examinations is right for you. What should I know about weight and diet? Eat a Healthy Diet  Eat plenty of vegetables, fruits, whole grains, low-fat dairy products, and lean protein.  Do not eat a lot of foods high in solid fats, added sugars, or salt.  Maintain a Healthy Weight Regular exercise can help you achieve or maintain a healthy weight. You should:  Do at least 150 minutes of exercise each week. The exercise should increase your heart rate and make you sweat (moderate-intensity exercise).  Do strength-training exercises at least twice a week.  Watch Your Levels of Cholesterol and Blood Lipids  Have your blood tested for lipids and cholesterol every 5 years starting at 49 years of age. If you are at high risk for heart disease, you should start having your blood tested when you are 49 years old. You may need  to have your cholesterol levels checked more often if: ? Your lipid or cholesterol levels are high. ? You are older than 49 years of age. ? You are at high risk for heart disease.  What should I know about cancer screening? Many types of cancers can be detected early and may often be prevented. Lung Cancer  You should be screened every year for lung cancer if: ? You are a current smoker who has smoked for at least 30 years. ? You are a former smoker who has quit within the past 15 years.  Talk to your health care provider about your screening options, when you should start screening, and how often you should be screened.  Colorectal Cancer  Routine colorectal cancer screening usually begins at 49 years of age and should be repeated every 5-10 years until you are 49 years old. You may need to be screened more often if early forms of precancerous polyps or small growths are found. Your health care provider may recommend screening at an earlier age if you have risk factors for colon cancer.  Your health care provider may recommend using home test kits to check for hidden blood in the stool.  A small camera at the end of a tube can be used to examine your colon (sigmoidoscopy or colonoscopy). This checks for the earliest forms of colorectal cancer.  Prostate and Testicular Cancer  Depending on your age and overall health, your health care provider may do certain tests to screen for prostate and testicular cancer.  Talk to your health care provider about any symptoms or concerns you have about testicular or prostate cancer.  Skin Cancer  Check your skin from head to toe regularly.  Tell your health care provider about any new moles or changes in moles, especially if: ? There is a change in a mole's size, shape, or color. ? You have a mole that is larger than a pencil eraser.  Always use sunscreen. Apply sunscreen liberally and repeat throughout the day.  Protect yourself by wearing  long sleeves, pants, a wide-brimmed hat, and sunglasses when outside.  What should I know about heart disease, diabetes, and high blood pressure?  If you are 4918-49 years of age, have your blood pressure checked every 3-5 years. If you are 49 years of age or older, have your blood pressure checked every year. You should have your blood pressure measured twice-once when you are at a hospital or clinic, and once when you are not at a hospital or clinic. Record the average of the two measurements. To check your blood pressure when you are not at a hospital or clinic, you can use: ? An automated blood pressure machine at a pharmacy. ? A home blood pressure monitor.  Talk to your health care provider about your target blood pressure.  If you are between 4345-49 years old, ask your health care provider if you should take aspirin to prevent heart disease.  Have regular diabetes screenings by checking your fasting blood sugar level. ? If you are at a normal weight and have a low risk for diabetes, have this test once every three years after the age of 49. ? If you are overweight and have a high risk for diabetes, consider being tested at a younger age or more often.  A one-time screening for abdominal aortic aneurysm (AAA) by ultrasound is recommended for men aged 65-75 years who are current or former smokers. What should I know about preventing infection? Hepatitis B If you have a higher risk for hepatitis B, you should be screened for this virus. Talk with your health care provider to find out if you are at risk for hepatitis B infection. Hepatitis C Blood testing is recommended for:  Everyone born from [birth year] through 1967.  Anyone with known risk factors for hepatitis C.  Sexually Transmitted Diseases (STDs)  You should be screened each year for STDs including gonorrhea and chlamydia if: ? You are sexually active and are younger than 49 years of age. ? You are older than 49 years of age and your  health care provider tells you that you are at risk for this type of infection. ? Your sexual activity has changed since you were last screened and you are at an increased risk for chlamydia or gonorrhea. Ask your health care provider if you are at risk.  Talk with your health care provider about whether you are at high risk of being infected with HIV. Your health care provider may recommend a prescription medicine to help prevent HIV infection.  What else can I do?  Schedule regular health, dental, and eye exams.  Stay current with your vaccines (immunizations).  Do not use any tobacco products, such as cigarettes, chewing tobacco, and e-cigarettes. If you need help quitting, ask your health care provider.  Limit alcohol intake to no more than 2 drinks per day. One drink equals 12 ounces of beer, 5 ounces of wine, or 1 ounces of hard liquor.  Do not use street drugs.  Do not share needles.  Ask your health care provider for help if you need support or information about quitting  drugs.  Tell your health care provider if you often feel depressed.  Tell your health care provider if you have ever been abused or do not feel safe at home. This information is not intended to replace advice given to you by your health care provider. Make sure you discuss any questions you have with your health care provider. Document Released: 02/14/2008 Document Revised: 04/16/2016 Document Reviewed: 05/22/2015 Elsevier Interactive Patient Education  [employer].

## 2018-02-03 ENCOUNTER — Other Ambulatory Visit: Payer: Self-pay | Admitting: Emergency Medicine

## 2018-02-03 ENCOUNTER — Telehealth: Payer: Self-pay | Admitting: Family Medicine

## 2018-02-03 ENCOUNTER — Other Ambulatory Visit: Payer: Self-pay

## 2018-02-03 DIAGNOSIS — Z0000 Encounter for general adult medical examination without abnormal findings: Secondary | ICD-10-CM

## 2018-02-03 DIAGNOSIS — Z789 Other specified health status: Secondary | ICD-10-CM

## 2018-02-03 DIAGNOSIS — Z113 Encounter for screening for infections with a predominantly sexual mode of transmission: Secondary | ICD-10-CM

## 2018-02-03 DIAGNOSIS — R17 Unspecified jaundice: Secondary | ICD-10-CM

## 2018-02-03 DIAGNOSIS — IMO0001 Reserved for inherently not codable concepts without codable children: Secondary | ICD-10-CM

## 2018-02-03 NOTE — Telephone Encounter (Signed)
Patient came in to have labs done.  Patient stated that provider informed him to come in anytime to have his fasting labs completed.  Patient was last seen on 01/29/18 for CPE by Dr. Sherie DonLada. Orders for a lipid panel, cmp, cbc, and HIV-screen were placed on today since there was no other orders in place and patient was very insistent on having labs done today due to his schedule.

## 2018-02-04 ENCOUNTER — Other Ambulatory Visit: Payer: Self-pay

## 2018-02-04 DIAGNOSIS — R17 Unspecified jaundice: Secondary | ICD-10-CM

## 2018-02-04 NOTE — Telephone Encounter (Signed)
Please add on direct bilirubin to blood in lab; thank you (already ordered, just release)

## 2018-02-04 NOTE — Telephone Encounter (Signed)
Given to cindy

## 2018-02-05 LAB — CBC WITH DIFFERENTIAL/PLATELET
BASOS ABS: 82 {cells}/uL (ref 0–200)
Basophils Relative: 0.9 %
Eosinophils Absolute: 537 cells/uL — ABNORMAL HIGH (ref 15–500)
Eosinophils Relative: 5.9 %
HCT: 44 % (ref 38.5–50.0)
Hemoglobin: 15.1 g/dL (ref 13.2–17.1)
Lymphs Abs: 2011 cells/uL (ref 850–3900)
MCH: 28.6 pg (ref 27.0–33.0)
MCHC: 34.3 g/dL (ref 32.0–36.0)
MCV: 83.3 fL (ref 80.0–100.0)
MPV: 11 fL (ref 7.5–12.5)
Monocytes Relative: 7.5 %
NEUTROS PCT: 63.6 %
Neutro Abs: 5788 cells/uL (ref 1500–7800)
PLATELETS: 290 10*3/uL (ref 140–400)
RBC: 5.28 10*6/uL (ref 4.20–5.80)
RDW: 12.8 % (ref 11.0–15.0)
TOTAL LYMPHOCYTE: 22.1 %
WBC mixed population: 683 cells/uL (ref 200–950)
WBC: 9.1 10*3/uL (ref 3.8–10.8)

## 2018-02-05 LAB — LIPID PANEL
CHOL/HDL RATIO: 3.8 (calc) (ref <5.0)
CHOLESTEROL: 181 mg/dL (ref <200)
HDL: 48 mg/dL (ref >40)
LDL CHOLESTEROL (CALC): 114 mg/dL — AB
Non-HDL Cholesterol (Calc): 133 mg/dL (calc) — ABNORMAL HIGH (ref <130)
Triglycerides: 89 mg/dL (ref <150)

## 2018-02-05 LAB — COMPLETE METABOLIC PANEL WITH GFR
AG Ratio: 2 (calc) (ref 1.0–2.5)
ALT: 26 U/L (ref 9–46)
AST: 26 U/L (ref 10–40)
Albumin: 4.7 g/dL (ref 3.6–5.1)
Alkaline phosphatase (APISO): 52 U/L (ref 40–115)
BUN: 15 mg/dL (ref 7–25)
CALCIUM: 9.4 mg/dL (ref 8.6–10.3)
CO2: 28 mmol/L (ref 20–32)
Chloride: 101 mmol/L (ref 98–110)
Creat: 1.27 mg/dL (ref 0.60–1.35)
GFR, EST AFRICAN AMERICAN: 77 mL/min/{1.73_m2} (ref >=60)
GFR, EST NON AFRICAN AMERICAN: 66 mL/min/{1.73_m2} (ref >=60)
GLUCOSE: 92 mg/dL (ref 65–99)
Globulin: 2.3 g/dL (calc) (ref 1.9–3.7)
Potassium: 4 mmol/L (ref 3.5–5.3)
Sodium: 138 mmol/L (ref 135–146)
TOTAL PROTEIN: 7 g/dL (ref 6.1–8.1)
Total Bilirubin: 1.6 mg/dL — ABNORMAL HIGH (ref 0.2–1.2)

## 2018-02-05 LAB — TEST AUTHORIZATION

## 2018-02-05 LAB — HIV ANTIBODY (ROUTINE TESTING W REFLEX): HIV 1&2 Ab, 4th Generation: NONREACTIVE

## 2018-02-05 LAB — PSA: PSA: 0.4 ng/mL (ref <=4.0)

## 2018-02-05 LAB — BILIRUBIN, DIRECT: BILIRUBIN DIRECT: 0.3 mg/dL — AB (ref 0.0–0.2)

## 2018-02-08 NOTE — Assessment & Plan Note (Signed)
USPSTF grade A and B recommendations reviewed with patient; age-appropriate recommendations, preventive care, screening tests, etc discussed and encouraged; healthy living encouraged; see AVS for patient education given to patient  

## 2018-06-09 ENCOUNTER — Telehealth: Payer: Self-pay | Admitting: Family Medicine

## 2018-06-09 NOTE — Telephone Encounter (Signed)
Copied from CRM [id number]. 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  Has the patient contacted their pharmacy? Yes.   (Agent: If no, request that the patient contact the pharmacy for the refill.) (Agent: If yes, when and what did the pharmacy advise?)  Preferred Pharmacy (with phone number or street name): [facility] [address] ([address]), [place] - [address]) [place] [address] Phone: [phone] Fax: [phone]  Agent: Please be advised that RX refills may take up to 3 business days. We ask that you follow-up with your pharmacy.

## 2018-06-09 NOTE — Telephone Encounter (Signed)
Called patient and it went to voicemail. This message was received on 06/09/18 and per the encounter, the patient was deployed to [place] on 06/05/18. Patient should not need refills as a years supply was sent in May 2019.

## 2018-06-09 NOTE — Telephone Encounter (Signed)
I called patient Left message to return my call so we can help him with his request When he calls, please put him through to me (backline)

## 2018-06-09 NOTE — Telephone Encounter (Signed)
He said that he is not requesting a refill,he is requesting the script to be changed per the [employer]. Patient does not want " omeprazole " because it probably has the same side effects. 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 Please contact patient

## 2018-06-10 MED ORDER — FAMOTIDINE 20 MG PO TABS: 20.0000 mg | ORAL_TABLET | Freq: Two times a day (BID) | ORAL | 1 refills | Status: DC | PRN

## 2018-06-10 NOTE — Addendum Note (Signed)
Addended by: Brylen Wagar, Janit Bern on: 06/10/2018 10:23 AM   Modules accepted: Orders

## 2018-06-10 NOTE — Telephone Encounter (Signed)
I spoke with patient; he is being deployed soon Concerns over reports of cancer with some lots of ranitidine Will switch to famotidine; Rx sent

## 2019-05-02 ENCOUNTER — Encounter: Admitting: Family Medicine

## 2019-05-17 ENCOUNTER — Ambulatory Visit (INDEPENDENT_AMBULATORY_CARE_PROVIDER_SITE_OTHER): Admitting: Family Medicine

## 2019-05-17 ENCOUNTER — Other Ambulatory Visit: Payer: Self-pay

## 2019-05-17 ENCOUNTER — Encounter: Payer: Self-pay | Admitting: Family Medicine

## 2019-05-17 VITALS — BP 126/82 | HR 71 | Temp 97.3°F | Resp 14 | Ht 68.0 in | Wt 195.5 lb

## 2019-05-17 DIAGNOSIS — Z125 Encounter for screening for malignant neoplasm of prostate: Secondary | ICD-10-CM

## 2019-05-17 DIAGNOSIS — Z0000 Encounter for general adult medical examination without abnormal findings: Secondary | ICD-10-CM

## 2019-05-17 DIAGNOSIS — Z23 Encounter for immunization: Secondary | ICD-10-CM

## 2019-05-17 DIAGNOSIS — K219 Gastro-esophageal reflux disease without esophagitis: Secondary | ICD-10-CM | POA: Diagnosis not present

## 2019-05-17 DIAGNOSIS — R350 Frequency of micturition: Secondary | ICD-10-CM

## 2019-05-17 DIAGNOSIS — M5442 Lumbago with sciatica, left side: Secondary | ICD-10-CM

## 2019-05-17 DIAGNOSIS — Z1211 Encounter for screening for malignant neoplasm of colon: Secondary | ICD-10-CM

## 2019-05-17 MED ORDER — OMEPRAZOLE 20 MG PO CPDR: 20.0000 mg | DELAYED_RELEASE_CAPSULE | Freq: Every day | ORAL | 1 refills | Status: DC

## 2019-05-17 MED ORDER — PREDNISONE 20 MG PO TABS: 40.0000 mg | ORAL_TABLET | Freq: Every day | ORAL | 0 refills | Status: AC

## 2019-05-17 NOTE — Progress Notes (Signed)
Patient: Harold Forbes, Male    DOB: 21-Jul-1971, 48 y.o.   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 male who presents today for health maintenance and annual & complete physical exam.  He feels well.  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, but COVID has decreased activity some, HIIT 35 min a day Diet generally described by pt three vegatables and 3 fruits a day and overall balanced He reports he is sleeping fairly well with melatonin    USPSTF grade A and B recommendations - reviewed and addressed today  Depression:  Phq 9 completed today by patient, was reviewed by me with patient in the room, score is  negative, pt feels generally good, sometimes has difficulty sleeping, but managed with melatonin PHQ 2/9 Scores 05/17/2019 01/29/2018 12/05/2016 11/19/2015  PHQ - 2 Score 0 1 0 0  PHQ- 9 Score 0 2 - -   [facility] 05/17/2019 01/29/2018 12/05/2016 11/19/2015  Decreased Interest 0 0 0 0  Down, Depressed, Hopeless 0 1 0 0  PHQ - 2 Score 0 1 0 0  Altered sleeping 0 1 - -  Tired, decreased energy 0 0 - -  Change in appetite 0 0 - -  Feeling bad or failure about yourself  0 0 - -  Trouble concentrating 0 0 - -  Moving slowly or fidgety/restless 0 0 - -  Suicidal thoughts 0 0 - -  PHQ-9 Score 0 2 - -  Difficult doing work/chores Not difficult at all Not difficult at all - -    [facility] STD testing and prevention (HIV/chl/gon/syphilis): none Prostate cancer: has monitored in the past, shared decision making after length discussion, pt wishes to recheck PSA today Lab Results  Component Value Date   PSA 0.4 02/03/2018   PSA 0.4 11/19/2016   Intimate partner violence: none, feels safe, lives with his  parents/family, just returned home  Urinary Symptoms:  IPSS Questionnaire (AUA-7): Over the past month   1)  How often have you had a sensation of not emptying your bladder completely after you finish urinating?  1 - Less than 1 time in 5  2)  How often have you had to urinate again less than two hours after you finished urinating? 1 - Less than 1 time in 5  3)  How often have you found you stopped and started again several times when you urinated?  0 - Not at all  4) How difficult have you found it to postpone urination?  0 - Not at all  5) How often have you had a weak urinary stream?  0 - Not at all  6) How often have you had to push or strain to begin urination?  1 - Less than 1 time in 5  7) How many times did you most typically get up to urinate from the time you went to bed until the time you got up in the morning?  3 - 3 times  Total score:  6  0-7 mildly symptomatic   8-19 moderately symptomatic   20-35 severely symptomatic  Wakes up to urinate - on a [employer] supplement   Skin cancer:  None in the past -  last  skin survey was.  Pt reports hx of skin cancer, suspicious lesions/biopsies in the past.  Colorectal cancer:  colonoscopy is due, just turned 48   Lung cancer: N/A Low Dose CT Chest recommended if Age 31-80 years, 30 pack-year currently smoking OR have quit w/in 15years.  Patient does not qualify.   Social History   Tobacco Use   Smoking status: Never Smoker   Smokeless tobacco: Never Used  Substance Use Topics   Alcohol use: Yes    Alcohol/week: 0.0 standard drinks    Comment: occasional     Alcohol screening:   [facility]  AUDIT-C Score  3      AAA:  N/A The USPSTF recommends one-time screening with ultrasonography in men ages 7065 to 4875 years who have ever smoked  ECG:  NONE  Blood pressure/Hypertension: BP Readings from Last 3 Encounters:  05/17/19 126/82  01/29/18 116/78  12/05/16 116/71    Weight/Obesity: Wt Readings from Last 3 Encounters:  05/17/19 195 lb 8 oz (88.7 kg)  01/29/18 195 lb 12.8 oz (88.8 kg)  12/05/16 189 lb (85.7 kg)   BMI Readings from Last 3 Encounters:  05/17/19 29.73 kg/m  01/29/18 29.77 kg/m  12/05/16 28.74 kg/m    Lipids:  Lab Results  Component Value Date   CHOL 181 02/03/2018   CHOL 143 11/19/2016   Lab Results  Component Value Date   HDL 48 02/03/2018   HDL 46 11/19/2016   Lab Results  Component Value Date   LDLCALC 114 (H) 02/03/2018   LDLCALC 80 11/19/2016   Lab Results  Component Value Date   TRIG 89 02/03/2018   TRIG 83 11/19/2016   Lab Results  Component Value Date   CHOLHDL 3.8 02/03/2018   CHOLHDL 3.1 11/19/2016   No results found for: LDLDIRECT Based on the results of lipid panel his/her cardiovascular risk factor ( using Poole Cohort )  in the next 10 years is : The 10-year ASCVD risk score Denman George(Goff DC Montez HagemanJr., et al., 2013) is: 3.1%   Values used to calculate the score:     Age: [age] years     Sex: Male     Is Non-Hispanic African American: No     Diabetic: No     Tobacco smoker: No     Systolic Blood Pressure: 126 mmHg     Is BP treated: No     HDL Cholesterol: 48 mg/dL     Total Cholesterol: 181 mg/dL Glucose:  Glucose  Date Value Ref Range Status  11/22/2015 110 (H) 65 - 99 mg/dL Final   Glucose, Bld  Date Value Ref Range Status  02/03/2018 92 65 - 99 mg/dL Final    Comment:    .            Fasting reference interval .   11/19/2016 89 65 - 99 mg/dL Final    Social History      He  reports that he has never smoked. He has never used smokeless tobacco. He reports current alcohol use. He reports that he does not use drugs.       Social History   Socioeconomic History   Marital status: Single    Spouse name: Not on file   Number of children: 0   Years of education: 12   Highest education level: Master's degree (e.g., MA, MS, MEng, MEd, MSW, MBA)  Occupational History   Not on file   Social Needs  Financial resource strain: Not hard at all   Food insecurity    Worry: Never true    Inability: Never true   Transportation needs    Medical: No    Non-medical: No  Tobacco Use   Smoking status: Never Smoker   Smokeless tobacco: Never Used  Substance and Sexual Activity   Alcohol use: Yes    Alcohol/week: 0.0 standard drinks    Comment: occasional   Drug use: No   Sexual activity: Yes  Lifestyle   Physical activity    Days per week: 5 days    Minutes per session: 30 min   Stress: Not at all  Relationships   Social connections    Talks on phone: More than three times a week    Gets together: Three times a week    Attends religious service: More than 4 times per year    Active member of club or organization: No    Attends meetings of clubs or organizations: Never    Relationship status: Not on file  Other Topics Concern   Not on file  Social History Narrative   Not on file         Social History   Socioeconomic History   Marital status: Single    Spouse name: Not on file   Number of children: 0   Years of education: 12   Highest education level: Master's degree (e.g., MA, MS, MEng, MEd, MSW, MBA)  Occupational History   Not on file  Social Needs   Financial resource strain: Not hard at all   Food insecurity    Worry: Never true    Inability: Never true   Transportation needs    Medical: No    Non-medical: No  Tobacco Use   Smoking status: Never Smoker   Smokeless tobacco: Never Used  Substance and Sexual Activity   Alcohol use: Yes    Alcohol/week: 0.0 standard drinks    Comment: occasional   Drug use: No   Sexual activity: Yes  Lifestyle   Physical activity    Days per week: 5 days    Minutes per session: 30 min   Stress: Not at all  Relationships   Social connections    Talks on phone: More than three times a week    Gets together: Three times a week    Attends religious service: More than 4 times  per year    Active member of club or organization: No    Attends meetings of clubs or organizations: Never    Relationship status: Not on file   Intimate partner violence    Fear of current or ex partner: No    Emotionally abused: No    Physically abused: No    Forced sexual activity: No  Other Topics Concern   Not on file  Social History Narrative   Not on file    Family History        Family Status  Relation Name Status   Mother  Alive   Father  Deceased   Sister  Alive   Brother  Alive   Sister  Alive   Brother  Alive   Mat Uncle  Deceased        His family history includes Cancer (age of onset: 52) in his father; Healthy in his mother; Lung cancer in his father; Prostate cancer in his maternal uncle.       Family History  Problem Relation Age of Onset   Healthy  Mother    Lung cancer Father    Cancer Father 34       lung   Prostate cancer Maternal Uncle     Patient Active Problem List   Diagnosis Date Noted   Preventative health care 01/29/2018   GERD (gastroesophageal reflux disease) 01/29/2018   Screening for STDs (sexually transmitted diseases) 11/19/2016   Right elbow tendonitis 11/19/2016   Annual physical exam 11/19/2015   Frequent urination 11/19/2015    Past Surgical History:  Procedure Laterality Date   LASIK Bilateral 2000   TONSILLECTOMY AND ADENOIDECTOMY       Current Outpatient Medications:    famotidine (PEPCID) 20 MG tablet, Take 1 tablet (20 mg total) by mouth 2 (two) times daily as needed for heartburn or indigestion. May take once or twice a day as needed, Disp: 180 tablet, Rfl: 1   meloxicam (MOBIC) 15 MG tablet, Take 1 tablet (15 mg total) by mouth daily as needed for pain. Caution: prolonged use can be detrimental to the kidneys (Patient not taking: Reported on 05/17/2019), Disp: 90 tablet, Rfl: 3   omeprazole (PRILOSEC) 20 MG capsule, Take 1 capsule (20 mg total) by mouth daily., Disp: 90 capsule, Rfl: 1    predniSONE (DELTASONE) 20 MG tablet, Take 2 tablets (40 mg total) by mouth daily with breakfast for 5 days., Disp: 10 tablet, Rfl: 0  No Known Allergies  Patient Care Team: Danelle Berry, PA-C as PCP - General (Family Medicine)  I personally reviewed active problem list, medication list, allergies, family history, social history, health maintenance, notes from last encounter, lab results with the patient/caregiver today.  Review of Systems  Constitutional: Negative.  Negative for activity change, appetite change, fatigue and unexpected weight change.  HENT: Negative.   Eyes: Negative.   Respiratory: Negative.  Negative for shortness of breath.   Cardiovascular: Negative.  Negative for chest pain, palpitations and leg swelling.  Gastrointestinal: Negative.  Negative for abdominal pain and blood in stool.  Endocrine: Negative.   Genitourinary: Negative.  Negative for decreased urine volume, difficulty urinating, testicular pain and urgency.  Skin: Negative.  Negative for color change and pallor.  Allergic/Immunologic: Negative.   Neurological: Negative.  Negative for syncope, weakness, light-headedness and numbness.  Psychiatric/Behavioral: Negative.  Negative for confusion, dysphoric mood, self-injury and suicidal ideas. The patient is not nervous/anxious.   All other systems reviewed and are negative.         Objective:   Vitals:  Vitals:   05/17/19 1428  BP: 126/82  Pulse: 71  Resp: 14  Temp: (!) 97.3 F (36.3 C)  SpO2: 99%  Weight: 195 lb 8 oz (88.7 kg)  Height: 5\' 8"  (1.727 m)    Body mass index is 29.73 kg/m.  Physical Exam Vitals signs and nursing note reviewed.  Constitutional:      General: He is not in acute distress.    Appearance: Normal appearance. He is well-developed. He is not ill-appearing or toxic-appearing.  HENT:     Head: Normocephalic and atraumatic.     Jaw: No trismus.     Right Ear: Tympanic membrane, ear canal and external ear normal.      Left Ear: Tympanic membrane, ear canal and external ear normal.     Nose: Nose normal. No mucosal edema or rhinorrhea.     Right Sinus: No maxillary sinus tenderness or frontal sinus tenderness.     Left Sinus: No maxillary sinus tenderness or frontal sinus tenderness.     Mouth/Throat:  Mouth: Mucous membranes are moist.     Pharynx: Oropharynx is clear. Uvula midline. No oropharyngeal exudate, posterior oropharyngeal erythema or uvula swelling.  Eyes:     General: Lids are normal. No scleral icterus.    Conjunctiva/sclera: Conjunctivae normal.     Pupils: Pupils are equal, round, and reactive to light.  Neck:     Musculoskeletal: Normal range of motion and neck supple.     Thyroid: No thyroid mass, thyromegaly or thyroid tenderness.     Trachea: Trachea and phonation normal. No tracheal deviation.  Cardiovascular:     Rate and Rhythm: Regular rhythm.     Pulses: Normal pulses.          Radial pulses are 2+ on the right side and 2+ on the left side.       Posterior tibial pulses are 2+ on the right side and 2+ on the left side.     Heart sounds: Normal heart sounds. No murmur. No friction rub. No gallop.   Pulmonary:     Effort: Pulmonary effort is normal.     Breath sounds: Normal breath sounds. No wheezing, rhonchi or rales.  Abdominal:     General: Bowel sounds are normal. There is no distension.     Palpations: Abdomen is soft.     Tenderness: There is no abdominal tenderness. There is no guarding or rebound.  Musculoskeletal: Normal range of motion.  Skin:    General: Skin is warm and dry.     Capillary Refill: Capillary refill takes less than 2 seconds.     Findings: No rash.  Neurological:     Mental Status: He is alert and oriented to person, place, and time.     Gait: Gait normal.  Psychiatric:        Mood and Affect: Mood normal.        Speech: Speech normal.        Behavior: Behavior normal.        Thought Content: Thought content normal.        Judgment:  Judgment normal.      PHQ2/9: Depression screen [facility] 2/9 05/17/2019 01/29/2018 12/05/2016 11/19/2015  Decreased Interest 0 0 0 0  Down, Depressed, Hopeless 0 1 0 0  PHQ - 2 Score 0 1 0 0  Altered sleeping 0 1 - -  Tired, decreased energy 0 0 - -  Change in appetite 0 0 - -  Feeling bad or failure about yourself  0 0 - -  Trouble concentrating 0 0 - -  Moving slowly or fidgety/restless 0 0 - -  Suicidal thoughts 0 0 - -  PHQ-9 Score 0 2 - -  Difficult doing work/chores Not difficult at all Not difficult at all - -    Fall Risk: Fall Risk  05/17/2019 01/29/2018 12/05/2016 11/19/2015  Falls in the past year? 0 No No No  Number falls in past yr: 0 - - -  Injury with Fall? 0 - - -     Assessment & Plan:    CPE completed today [facility] - pt has signed for release of records to get lab results   Prostate cancer screening and PSA options (with potential risks and benefits of testing vs not testing) were discussed along with recent recs/guidelines, shared decision making and handout/information given to pt today   USPSTF grade A and B recommendations reviewed with patient; age-appropriate recommendations, preventive care, screening tests, etc discussed and encouraged; healthy  living encouraged; see AVS for patient education given to patient   Discussed importance of 150 minutes of physical activity weekly, AHA exercise recommendations given to pt in AVS/handout   Discussed importance of healthy diet:  eating lean meats and proteins, avoiding trans fats and saturated fats, avoid simple sugars and excessive carbs in diet, eat 6 servings of fruit/vegetables daily and drink plenty of water and avoid sweet beverages.  DASH diet reviewed if pt has HTN   Recommended pt to do annual eye exam and routine dental exams/cleanings   Reviewed Health Maintenance: Health Maintenance  Topic Date Due   TETANUS/TDAP  05/15/1988   INFLUENZA VACCINE  04/02/2019   COLONOSCOPY   05/16/2019   HIV Screening  Completed     Immunizations: Immunization History  Administered Date(s) Administered   Influenza,inj,Quad PF,6+ Mos 05/17/2019   Influenza-Unspecified 06/02/2015     ICD-10-CM   1. General medical examination  Z00.00   2. Gastroesophageal reflux disease, esophagitis presence not specified  K21.9 omeprazole (PRILOSEC) 20 MG capsule  3. Prostate cancer screening  Z12.5 PSA  4. Urinary frequency  R35.0   5. Screening for colon cancer  Z12.11 Ambulatory referral to Gastroenterology  6. Bilateral low back pain with left-sided sciatica, unspecified chronicity  M54.42 predniSONE (DELTASONE) 20 MG tablet  7. Need for influenza vaccination  Z23 Flu Vaccine QUAD 6+ mos PF IM (Fluarix Quad PF)    [facility] mentioned [facility] - strain from April, reexacerbated recently with some sciatic nerve pain, he's waiting on their referrals was given muscle relaxer and Naproxen - offered prednisone burst to tx sciatica, he will hold and only fill if it gets more severe, right now 1/10 pain.  GERD - fairly well controlled with diet, prilosec PRN  All labs done through the [place] - CMP, A1C, Lipid panel, CBC - we did PSA today  Urinary sx are mild and worse with certain food/drink triggers.  He does not want to go to urologist right now.  Rechecking PSA to compare to past levels  Otherwise pt appears well, a little overweight but working on diet and exercise.     Return in about 1 year (around 05/16/2020) for Annual Physical.  Danelle Berry, PA-C 05/17/19 3:19 PM  [facility]

## 2019-05-17 NOTE — Patient Instructions (Signed)
If you have severe nerve pain again associated with your back and radiating to your buttock or leg, you can fill and take the steroids (prednisone).  If you do take it in the morning with food and hold NSAIDs including ibuprofen Aleve or naproxen  Is very good to meet you we will contact you with your PSA level  Thank you for signing for your labs please let me know if we are going to be managing any chronic conditions like elevated cholesterol etc.   Preventive Care 50-4 Years Old, Male Preventive care refers to lifestyle choices and visits with your health care provider that can promote health and wellness. This includes:  A yearly physical exam. This is also called an annual well check.  Regular dental and eye exams.  Immunizations.  Screening for certain conditions.  Healthy lifestyle choices, such as eating a healthy diet, getting regular exercise, not using drugs or products that contain nicotine and tobacco, and limiting alcohol use. What can I expect for my preventive care visit? Physical exam Your health care provider will check:  Height and weight. These may be used to calculate body mass index (BMI), which is a measurement that tells if you are at a healthy weight.  Heart rate and blood pressure.  Your skin for abnormal spots. Counseling Your health care provider may ask you questions about:  Alcohol, tobacco, and drug use.  Emotional well-being.  Home and relationship well-being.  Sexual activity.  Eating habits.  Work and work Statistician. What immunizations do I need?  Influenza (flu) vaccine  This is recommended every year. Tetanus, diphtheria, and pertussis (Tdap) vaccine  You may need a Td booster every 10 years. Varicella (chickenpox) vaccine  You may need this vaccine if you have not already been vaccinated. Zoster (shingles) vaccine  You may need this after age 50. Measles, mumps, and rubella (MMR) vaccine  You may need at least one dose  of MMR if you were born in 1957 or later. You may also need a second dose. Pneumococcal conjugate (PCV13) vaccine  You may need this if you have certain conditions and were not previously vaccinated. Pneumococcal polysaccharide (PPSV23) vaccine  You may need one or two doses if you smoke cigarettes or if you have certain conditions. Meningococcal conjugate (MenACWY) vaccine  You may need this if you have certain conditions. Hepatitis A vaccine  You may need this if you have certain conditions or if you travel or work in places where you may be exposed to hepatitis A. Hepatitis B vaccine  You may need this if you have certain conditions or if you travel or work in places where you may be exposed to hepatitis B. Haemophilus influenzae type b (Hib) vaccine  You may need this if you have certain risk factors. Human papillomavirus (HPV) vaccine  If recommended by your health care provider, you may need three doses over 6 months. You may receive vaccines as individual doses or as more than one vaccine together in one shot (combination vaccines). Talk with your health care provider about the risks and benefits of combination vaccines. What tests do I need? Blood tests  Lipid and cholesterol levels. These may be checked every 5 years, or more frequently if you are over 6 years old.  Hepatitis C test.  Hepatitis B test. Screening  Lung cancer screening. You may have this screening every year starting at age 50 if you have a 30-pack-year history of smoking and currently smoke or have quit within  the past 15 years.  Prostate cancer screening. Recommendations will vary depending on your family history and other risks.  Colorectal cancer screening. All adults should have this screening starting at age 50 and continuing until age 23. Your health care provider may recommend screening at age 50 if you are at increased risk. You will have tests every 1-10 years, depending on your results and the  type of screening test.  Diabetes screening. This is done by checking your blood sugar (glucose) after you have not eaten for a while (fasting). You may have this done every 1-3 years.  Sexually transmitted disease (STD) testing. Follow these instructions at home: Eating and drinking  Eat a diet that includes fresh fruits and vegetables, whole grains, lean protein, and low-fat dairy products.  Take vitamin and mineral supplements as recommended by your health care provider.  Do not drink alcohol if your health care provider tells you not to drink.  If you drink alcohol: ? Limit how much you have to 0-2 drinks a day. ? Be aware of how much alcohol is in your drink. In the [place] one drink equals one 12 oz bottle of beer (355 mL), one 5 oz glass of wine (148 mL), or one 1 oz glass of hard liquor (44 mL). Lifestyle  Take daily care of your teeth and gums.  Stay active. Exercise for at least 30 minutes on 5 or more days each week.  Do not use any products that contain nicotine or tobacco, such as cigarettes, e-cigarettes, and chewing tobacco. If you need help quitting, ask your health care provider.  If you are sexually active, practice safe sex. Use a condom or other form of protection to prevent STIs (sexually transmitted infections).  Talk with your health care provider about taking a low-dose aspirin every day starting at age 50. What's next?  Go to your health care provider once a year for a well check visit.  Ask your health care provider how often you should have your eyes and teeth checked.  Stay up to date on all vaccines. This information is not intended to replace advice given to you by your health care provider. Make sure you discuss any questions you have with your health care provider. Document Released: 09/14/2015 Document Revised: 08/12/2018 Document Reviewed: 08/12/2018 Elsevier Patient Education  2020 [employer].

## 2019-05-18 ENCOUNTER — Encounter: Payer: Self-pay | Admitting: Family Medicine

## 2019-05-18 LAB — PSA: PSA: 0.3 ng/mL (ref <=4.0)

## 2019-05-23 ENCOUNTER — Encounter: Payer: Self-pay | Admitting: Family Medicine

## 2019-05-27 ENCOUNTER — Encounter: Payer: Self-pay | Admitting: Family Medicine

## 2019-06-13 ENCOUNTER — Other Ambulatory Visit: Payer: Self-pay

## 2019-06-13 ENCOUNTER — Telehealth: Payer: Self-pay

## 2019-06-13 DIAGNOSIS — Z1211 Encounter for screening for malignant neoplasm of colon: Secondary | ICD-10-CM

## 2019-06-13 NOTE — Telephone Encounter (Signed)
Gastroenterology Pre-Procedure Review  Request Date: Monday 06/27/19 Requesting Physician: Dr. Marius Ditch  PATIENT REVIEW QUESTIONS: The patient responded to the following health history questions as indicated:    1. Are you having any GI issues? no 2. Do you have a personal history of Polyps? no 3. Do you have a family history of Colon Cancer or Polyps? no 4. Diabetes Mellitus? no 5. Joint replacements in the past 12 months?no 6. Major health problems in the past 3 months?no 7. Any artificial heart valves, MVP, or defibrillator?no    MEDICATIONS & ALLERGIES:    Patient reports the following regarding taking any anticoagulation/antiplatelet therapy:   Plavix, Coumadin, Eliquis, Xarelto, Lovenox, Pradaxa, Brilinta, or Effient? no Aspirin? no  Patient confirms/reports the following medications:  Current Outpatient Medications  Medication Sig Dispense Refill  . famotidine (PEPCID) 20 MG tablet Take 1 tablet (20 mg total) by mouth 2 (two) times daily as needed for heartburn or indigestion. May take once or twice a day as needed 180 tablet 1  . meloxicam (MOBIC) 15 MG tablet Take 1 tablet (15 mg total) by mouth daily as needed for pain. Caution: prolonged use can be detrimental to the kidneys (Patient not taking: Reported on 05/17/2019) 90 tablet 3  . omeprazole (PRILOSEC) 20 MG capsule Take 1 capsule (20 mg total) by mouth daily. 90 capsule 1   No current facility-administered medications for this visit.     Patient confirms/reports the following allergies:  No Known Allergies  No orders of the defined types were placed in this encounter.   AUTHORIZATION INFORMATION Primary Insurance: 1D#: Group #:  Secondary Insurance: 1D#: Group #:  SCHEDULE INFORMATION: Date: 06/27/19 Time: Location:

## 2019-06-23 ENCOUNTER — Other Ambulatory Visit
Admission: RE | Admit: 2019-06-23 | Discharge: 2019-06-23 | Disposition: A | Discharge status: Home / Self Care | Source: Ambulatory Visit | Attending: Gastroenterology | Admitting: Gastroenterology

## 2019-06-23 ENCOUNTER — Other Ambulatory Visit: Payer: Self-pay

## 2019-06-23 DIAGNOSIS — Z20828 Contact with and (suspected) exposure to other viral communicable diseases: Secondary | ICD-10-CM | POA: Diagnosis not present

## 2019-06-23 DIAGNOSIS — Z01812 Encounter for preprocedural laboratory examination: Secondary | ICD-10-CM | POA: Diagnosis not present

## 2019-06-23 LAB — SARS CORONAVIRUS 2 (TAT 6-24 HRS): SARS Coronavirus 2: NEGATIVE

## 2019-06-27 ENCOUNTER — Ambulatory Visit: Admitting: Certified Registered"

## 2019-06-27 ENCOUNTER — Ambulatory Visit
Admission: RE | Admit: 2019-06-27 | Discharge: 2019-06-27 | Disposition: A | Discharge status: Home / Self Care | Attending: Gastroenterology | Admitting: Gastroenterology

## 2019-06-27 ENCOUNTER — Encounter
Admission: RE | Disposition: A | Discharge status: Home / Self Care | Payer: Self-pay | Source: Home / Self Care | Attending: Gastroenterology

## 2019-06-27 ENCOUNTER — Other Ambulatory Visit: Payer: Self-pay

## 2019-06-27 DIAGNOSIS — Z1211 Encounter for screening for malignant neoplasm of colon: Secondary | ICD-10-CM | POA: Insufficient documentation

## 2019-06-27 DIAGNOSIS — J45909 Unspecified asthma, uncomplicated: Secondary | ICD-10-CM | POA: Diagnosis not present

## 2019-06-27 DIAGNOSIS — K219 Gastro-esophageal reflux disease without esophagitis: Secondary | ICD-10-CM | POA: Insufficient documentation

## 2019-06-27 DIAGNOSIS — F988 Other specified behavioral and emotional disorders with onset usually occurring in childhood and adolescence: Secondary | ICD-10-CM | POA: Insufficient documentation

## 2019-06-27 DIAGNOSIS — Z79899 Other long term (current) drug therapy: Secondary | ICD-10-CM | POA: Diagnosis not present

## 2019-06-27 DIAGNOSIS — Z791 Long term (current) use of non-steroidal anti-inflammatories (NSAID): Secondary | ICD-10-CM | POA: Insufficient documentation

## 2019-06-27 HISTORY — PX: COLONOSCOPY WITH PROPOFOL: SHX5780

## 2019-06-27 SURGERY — COLONOSCOPY WITH PROPOFOL
Anesthesia: Monitor Anesthesia Care

## 2019-06-27 MED ORDER — SODIUM CHLORIDE 0.9 % IV SOLN
INTRAVENOUS | Status: DC
  Administered 2019-06-27: 1000 mL via INTRAVENOUS

## 2019-06-27 MED ORDER — PROPOFOL 500 MG/50ML IV EMUL
INTRAVENOUS | Status: DC | PRN
  Administered 2019-06-27: 30 mg via INTRAVENOUS
  Administered 2019-06-27: 20 mg via INTRAVENOUS
  Administered 2019-06-27: 80 mg via INTRAVENOUS
  Administered 2019-06-27 (×3): 20 mg via INTRAVENOUS

## 2019-06-27 NOTE — Op Note (Signed)
[facility] Gastroenterology Patient Name: Harold Forbes Procedure Date: 06/27/2019 10:33 AM MRN: [id number] Account #: [id number] Date of Birth: 1971/06/17 Admit Type: Outpatient Age: 48 Room: [facility] ENDO ROOM 2 Gender: Male Note Status: Finalized Procedure:            Colonoscopy Indications:          Screening for colorectal malignant neoplasm, This is                        the patient's first colonoscopy Providers:            Lin Landsman MD, MD Medicines:            Monitored Anesthesia Care Complications:        No immediate complications. Estimated blood loss: None. Procedure:            Pre-Anesthesia Assessment:                       - Prior to the procedure, a History and Physical was                        performed, and patient medications and allergies were                        reviewed. The patient is competent. The risks and                        benefits of the procedure and the sedation options and                        risks were discussed with the patient. All questions                        were answered and informed consent was obtained.                        Patient identification and proposed procedure were                        verified by the physician, the nurse, the                        anesthesiologist, the anesthetist and the technician in                        the pre-procedure area in the procedure room in the                        endoscopy suite. Mental Status Examination: alert and                        oriented. Airway Examination: normal oropharyngeal                        airway and neck mobility. Respiratory Examination:                        clear to auscultation. CV Examination: normal.  Prophylactic Antibiotics: The patient does not require                        prophylactic antibiotics. Prior Anticoagulants: The                        patient has taken no previous anticoagulant  or                        antiplatelet agents. ASA Grade Assessment: II - A                        patient with mild systemic disease. After reviewing the                        risks and benefits, the patient was deemed in                        satisfactory condition to undergo the procedure. The                        anesthesia plan was to use monitored anesthesia care                        (MAC). Immediately prior to administration of                        medications, the patient was re-assessed for adequacy                        to receive sedatives. The heart rate, respiratory rate,                        oxygen saturations, blood pressure, adequacy of                        pulmonary ventilation, and response to care were                        monitored throughout the procedure. The physical status                        of the patient was re-assessed after the procedure.                       After obtaining informed consent, the colonoscope was                        passed under direct vision. Throughout the procedure,                        the patient's blood pressure, pulse, and oxygen                        saturations were monitored continuously. The                        Colonoscope was introduced through the anus and  advanced to the the terminal ileum, with identification                        of the appendiceal orifice and IC valve. The                        colonoscopy was performed without difficulty. The                        patient tolerated the procedure well. The quality of                        the bowel preparation was evaluated using the BBPS                        [facility] Bowel Preparation Scale) with scores of: Right                        Colon = 3, Transverse Colon = 3 and Left Colon = 3                        (entire mucosa seen well with no residual staining,                        small fragments of stool or opaque  liquid). The total                        BBPS score equals 9. Findings:      The perianal and digital rectal examinations were normal. Pertinent       negatives include normal sphincter tone.      The terminal ileum appeared normal.      The entire examined colon appeared normal. Unable to perform       retroflexion despite several attempts Impression:           - No specimens collected. Recommendation:       - Discharge patient to home (with escort).                       - Resume previous diet today.                       - Continue present medications.                       - Repeat colonoscopy in 10 years for surveillance. Procedure Code(s):    --- Professional ---                       B0175, Colorectal cancer screening; colonoscopy on                        individual not meeting criteria for high risk Diagnosis Code(s):    --- Professional ---                       Z12.11, Encounter for screening for malignant neoplasm                        of colon CPT copyright 2019 American Medical Association. All rights reserved. The codes documented in  this report are preliminary and upon coder review may  be revised to meet current compliance requirements. Dr. Ulyess Mort Lin Landsman MD, MD 06/27/2019 10:57:33 AM This report has been signed electronically. Number of Addenda: 0 Note Initiated On: 06/27/2019 10:33 AM Scope Withdrawal Time: 0 hours 8 minutes 1 second  Total Procedure Duration: 0 hours 9 minutes 54 seconds  Estimated Blood Loss: Estimated blood loss: none. Estimated blood loss: none.      [facility]

## 2019-06-27 NOTE — Anesthesia Post-op Follow-up Note (Signed)
Anesthesia QCDR form completed.        

## 2019-06-27 NOTE — H&P (Signed)
Cephas Darby, MD [address]  [place], [place] [address]  Main: [phone]  Fax: [phone] Pager: [phone]  Primary Care Physician:  Delsa Grana, PA-C Primary Gastroenterologist:  Dr. Cephas Darby  Pre-Procedure History & Physical: HPI:  Harold Forbes is a 48 y.o. male is here for an colonoscopy.   Past Medical History:  Diagnosis Date  . ADD (attention deficit disorder)   . Asthma   . DDD (degenerative disc disease), lumbar   . GERD (gastroesophageal reflux disease)     Past Surgical History:  Procedure Laterality Date  . LASIK Bilateral 2000  . TONSILLECTOMY AND ADENOIDECTOMY      Prior to Admission medications   Medication Sig Start Date End Date Taking? Authorizing Provider  famotidine (PEPCID) 20 MG tablet Take 1 tablet (20 mg total) by mouth 2 (two) times daily as needed for heartburn or indigestion. May take once or twice a day as needed 06/10/18  Yes Lada, Satira Anis, MD  omeprazole (PRILOSEC) 20 MG capsule Take 1 capsule (20 mg total) by mouth daily. 05/17/19  Yes Delsa Grana, PA-C  meloxicam (MOBIC) 15 MG tablet Take 1 tablet (15 mg total) by mouth daily as needed for pain. Caution: prolonged use can be detrimental to the kidneys Patient not taking: Reported on 05/17/2019 01/29/18   Arnetha Courser, MD    Allergies as of 06/13/2019  . (No Known Allergies)    Family History  Problem Relation Age of Onset  . Healthy Mother   . Lung cancer Father   . Cancer Father 41       lung  . Prostate cancer Maternal Uncle     Social History   Socioeconomic History  . Marital status: Single    Spouse name: Not on file  . Number of children: 0  . Years of education: 59  . Highest education level: Master's degree (e.g., MA, MS, MEng, MEd, MSW, MBA)  Occupational History  . Not on file  Social Needs  . Financial resource strain: Not hard at all  . Food insecurity    Worry: Never true    Inability: Never true  . Transportation  needs    Medical: No    Non-medical: No  Tobacco Use  . Smoking status: Never Smoker  . Smokeless tobacco: Never Used  Substance and Sexual Activity  . Alcohol use: Yes    Alcohol/week: 0.0 standard drinks    Comment: occasional  . Drug use: No  . Sexual activity: Yes  Lifestyle  . Physical activity    Days per week: 5 days    Minutes per session: 30 min  . Stress: Not at all  Relationships  . Social connections    Talks on phone: More than three times a week    Gets together: Three times a week    Attends religious service: More than 4 times per year    Active member of club or organization: No    Attends meetings of clubs or organizations: Never    Relationship status: Not on file  . Intimate partner violence    Fear of current or ex partner: No    Emotionally abused: No    Physically abused: No    Forced sexual activity: No  Other Topics Concern  . Not on file  Social History Narrative  . Not on file    Review of Systems: See HPI, otherwise negative ROS  Physical Exam: BP 122/89   Pulse 63  Temp (!) 95.5 F (35.3 C) (Tympanic)   Resp 20   Ht 5\' 9"  (1.753 m)   Wt 87.1 kg   SpO2 98%   BMI 28.35 kg/m  General:   Alert,  pleasant and cooperative in NAD Head:  Normocephalic and atraumatic. Neck:  Supple; no masses or thyromegaly. Lungs:  Clear throughout to auscultation.    Heart:  Regular rate and rhythm. Abdomen:  Soft, nontender and nondistended. Normal bowel sounds, without guarding, and without rebound.   Neurologic:  Alert and  oriented x4;  grossly normal neurologically.  Impression/Plan: Harold Forbes is here for an colonoscopy to be performed for colon cancer screening  Risks, benefits, limitations, and alternatives regarding  colonoscopy have been reviewed with the patient.  Questions have been answered.  All parties agreeable.   Orrin Brigham, MD  06/27/2019, 10:34 AM

## 2019-06-27 NOTE — Transfer of Care (Signed)
Immediate Anesthesia Transfer of Care Note  Patient: Harold Forbes  Procedure(s) Performed: COLONOSCOPY WITH PROPOFOL (N/A )  Patient Location: PACU  Anesthesia Type:MAC  Level of Consciousness: awake, alert  and oriented  Airway & Oxygen Therapy: Patient Spontanous Breathing and Patient connected to nasal cannula oxygen  Post-op Assessment: Report given to RN and Post -op Vital signs reviewed and stable  Post vital signs: Reviewed and stable  Last Vitals:  Vitals Value Taken Time  BP 116/93 06/27/19 1104  Temp    Pulse 79 06/27/19 1107  Resp 21 06/27/19 1107  SpO2 99 % 06/27/19 1107  Vitals shown include unvalidated device data.  Last Pain:  Vitals:   06/27/19 1105  TempSrc:   PainSc: 0-No pain         Complications: No apparent anesthesia complications

## 2019-06-27 NOTE — Anesthesia Preprocedure Evaluation (Addendum)
Anesthesia Evaluation  Patient identified by MRN, date of birth, ID band Patient awake    Reviewed: Allergy & Precautions, NPO status , Patient's Chart, lab work & pertinent test results  History of Anesthesia Complications Negative for: history of anesthetic complications  Airway Mallampati: II       Dental   Pulmonary asthma , neg sleep apnea, neg COPD, Not current smoker,           Cardiovascular (-) hypertension(-) Past MI and (-) CHF (-) dysrhythmias (-) Valvular Problems/Murmurs     Neuro/Psych neg Seizures    GI/Hepatic Neg liver ROS, GERD  Medicated and Controlled,  Endo/Other  neg diabetes  Renal/GU negative Renal ROS     Musculoskeletal   Abdominal   Peds  Hematology   Anesthesia Other Findings   Reproductive/Obstetrics                            Anesthesia Physical Anesthesia Plan  ASA: II  Anesthesia Plan: General   Post-op Pain Management:    Induction: Intravenous  PONV Risk Score and Plan: 2 and Propofol infusion and TIVA  Airway Management Planned: Nasal Cannula  Additional Equipment:   Intra-op Plan:   Post-operative Plan:   Informed Consent: I have reviewed the patients History and Physical, chart, labs and discussed the procedure including the risks, benefits and alternatives for the proposed anesthesia with the patient or authorized representative who has indicated his/her understanding and acceptance.       Plan Discussed with:   Anesthesia Plan Comments:         Anesthesia Quick Evaluation

## 2019-06-27 NOTE — Anesthesia Postprocedure Evaluation (Signed)
Anesthesia Post Note  Patient: Harold Forbes  Procedure(s) Performed: COLONOSCOPY WITH PROPOFOL (N/A )  Patient location during evaluation: Endoscopy Anesthesia Type: General Level of consciousness: awake and alert Pain management: pain level controlled Vital Signs Assessment: post-procedure vital signs reviewed and stable Respiratory status: spontaneous breathing and respiratory function stable Cardiovascular status: stable Anesthetic complications: no Comments: Pt vomited at the end of the procedure, saturations dipped to mid 70s. With mask ventilation and 100% O2, sats quickly back to upper 90s. Pt doing well in recovery.     Last Vitals:  Vitals:   06/27/19 0954  BP: 122/89  Pulse: 63  Resp: 20  Temp: (!) 35.3 C  SpO2: 98%    Last Pain:  Vitals:   06/27/19 1105  TempSrc:   PainSc: 0-No pain                 KEPHART,WILLIAM K

## 2019-06-27 NOTE — Progress Notes (Signed)
Received patient in post procedure room 9. He was on 6L of oxygen and has been weaned down to 3L. Checked with Dr. Ronelle Nigh prior to giving patient water to drink and Harold Forbes was able to tolerate drinking without any complications. He remains alert, oriented, pain free, with no signs of respiratory distress.

## 2019-06-28 ENCOUNTER — Encounter: Payer: Self-pay | Admitting: Gastroenterology

## 2019-12-28 ENCOUNTER — Ambulatory Visit: Admitting: Family Medicine

## 2020-01-04 ENCOUNTER — Other Ambulatory Visit: Payer: Self-pay

## 2020-01-04 ENCOUNTER — Ambulatory Visit (INDEPENDENT_AMBULATORY_CARE_PROVIDER_SITE_OTHER): Admitting: Family Medicine

## 2020-01-04 ENCOUNTER — Encounter: Payer: Self-pay | Admitting: Family Medicine

## 2020-01-04 VITALS — BP 132/80 | HR 85 | Temp 97.3°F | Resp 14 | Ht 68.0 in | Wt 195.9 lb

## 2020-01-04 DIAGNOSIS — K219 Gastro-esophageal reflux disease without esophagitis: Secondary | ICD-10-CM

## 2020-01-04 DIAGNOSIS — M5441 Lumbago with sciatica, right side: Secondary | ICD-10-CM | POA: Diagnosis not present

## 2020-01-04 DIAGNOSIS — G4700 Insomnia, unspecified: Secondary | ICD-10-CM

## 2020-01-04 DIAGNOSIS — M5442 Lumbago with sciatica, left side: Secondary | ICD-10-CM

## 2020-01-04 DIAGNOSIS — F419 Anxiety disorder, unspecified: Secondary | ICD-10-CM

## 2020-01-04 DIAGNOSIS — G8929 Other chronic pain: Secondary | ICD-10-CM

## 2020-01-04 MED ORDER — TRAZODONE HCL 50 MG PO TABS: 25.0000 mg | ORAL_TABLET | Freq: Every evening | ORAL | 1 refills | Status: DC | PRN

## 2020-01-04 MED ORDER — DICLOFENAC EPOLAMINE 1.3 % EX PTCH: 1.0000 | MEDICATED_PATCH | Freq: Two times a day (BID) | CUTANEOUS | 2 refills | Status: DC | PRN

## 2020-01-04 MED ORDER — PREDNISONE 20 MG PO TABS: 40.0000 mg | ORAL_TABLET | Freq: Every day | ORAL | 0 refills | Status: AC

## 2020-01-04 MED ORDER — OMEPRAZOLE 20 MG PO CPDR: 20.0000 mg | DELAYED_RELEASE_CAPSULE | Freq: Every day | ORAL | 1 refills | Status: AC

## 2020-01-04 NOTE — Patient Instructions (Signed)
 Managing Anxiety, Adult After being diagnosed with an anxiety disorder, you may be relieved to know why you have felt or behaved a certain way. You may also feel overwhelmed about the treatment ahead and what it will mean for your life. With care and support, you can manage this condition and recover from it. How to manage lifestyle changes Managing stress and anxiety  Stress is your body's reaction to life changes and events, both good and bad. Most stress will last just a few hours, but stress can be ongoing and can lead to more than just stress. Although stress can play a major role in anxiety, it is not the same as anxiety. Stress is usually caused by something external, such as a deadline, test, or competition. Stress normally passes after the triggering event has ended.  Anxiety is caused by something internal, such as imagining a terrible outcome or worrying that something will go wrong that will devastate you. Anxiety often does not go away even after the triggering event is over, and it can become long-term (chronic) worry. It is important to understand the differences between stress and anxiety and to manage your stress effectively so that it does not lead to an anxious response. Talk with your health care provider or a counselor to learn more about reducing anxiety and stress. He or she may suggest tension reduction techniques, such as:  Music therapy. This can include creating or listening to music that you enjoy and that inspires you.  Mindfulness-based meditation. This involves being aware of your normal breaths while not trying to control your breathing. It can be done while sitting or walking.  Centering prayer. This involves focusing on a word, phrase, or sacred image that means something to you and brings you peace.  Deep breathing. To do this, expand your stomach and inhale slowly through your nose. Hold your breath for 3-5 seconds. Then exhale slowly, letting your stomach  muscles relax.  Self-talk. This involves identifying thought patterns that lead to anxiety reactions and changing those patterns.  Muscle relaxation. This involves tensing muscles and then relaxing them. Choose a tension reduction technique that suits your lifestyle and personality. These techniques take time and practice. Set aside 5-15 minutes a day to do them. Therapists can offer counseling and training in these techniques. The training to help with anxiety may be covered by some insurance plans. Other things you can do to manage stress and anxiety include:  Keeping a stress/anxiety diary. This can help you learn what triggers your reaction and then learn ways to manage your response.  Thinking about how you react to certain situations. You may not be able to control everything, but you can control your response.  Making time for activities that help you relax and not feeling guilty about spending your time in this way.  Visual imagery and yoga can help you stay calm and relax.  Medicines Medicines can help ease symptoms. Medicines for anxiety include:  Anti-anxiety drugs.  Antidepressants. Medicines are often used as a primary treatment for anxiety disorder. Medicines will be prescribed by a health care provider. When used together, medicines, psychotherapy, and tension reduction techniques may be the most effective treatment. Relationships Relationships can play a big part in helping you recover. Try to spend more time connecting with trusted friends and family members. Consider going to couples counseling, taking family education classes, or going to family therapy. Therapy can help you and others better understand your condition. How to recognize changes in   your anxiety Everyone responds differently to treatment for anxiety. Recovery from anxiety happens when symptoms decrease and stop interfering with your daily activities at home or work. This may mean that you will start  to:  Have better concentration and focus. Worry will interfere less in your daily thinking.  Sleep better.  Be less irritable.  Have more energy.  Have improved memory. It is important to recognize when your condition is getting worse. Contact your health care provider if your symptoms interfere with home or work and you feel like your condition is not improving. Follow these instructions at home: Activity  Exercise. Most adults should do the following: ? Exercise for at least 150 minutes each week. The exercise should increase your heart rate and make you sweat (moderate-intensity exercise). ? Strengthening exercises at least twice a week.  Get the right amount and quality of sleep. Most adults need 7-9 hours of sleep each night. Lifestyle   Eat a healthy diet that includes plenty of vegetables, fruits, whole grains, low-fat dairy products, and lean protein. Do not eat a lot of foods that are high in solid fats, added sugars, or salt.  Make choices that simplify your life.  Do not use any products that contain nicotine or tobacco, such as cigarettes, e-cigarettes, and chewing tobacco. If you need help quitting, ask your health care provider.  Avoid caffeine, alcohol, and certain over-the-counter cold medicines. These may make you feel worse. Ask your pharmacist which medicines to avoid. General instructions  Take over-the-counter and prescription medicines only as told by your health care provider.  Keep all follow-up visits as told by your health care provider. This is important. Where to find support You can get help and support from these sources:  Self-help groups.  Online and [employer].  A trusted spiritual leader.  Couples counseling.  Family education classes.  Family therapy. Where to find more information You may find that joining a support group helps you deal with your anxiety. The following sources can help you locate counselors or support  groups near you:  [place]: www.mentalhealth[place].net  Anxiety and Depression Association of [place] (ADAA): https://www.clark.net/  National Alliance on Mental Illness (NAMI): www.nami.org Contact a health care provider if you:  Have a hard time staying focused or finishing daily tasks.  Spend many hours a day feeling worried about everyday life.  Become exhausted by worry.  Start to have headaches, feel tense, or have nausea.  Urinate more than normal.  Have diarrhea. Get help right away if you have:  A racing heart and shortness of breath.  Thoughts of hurting yourself or others. If you ever feel like you may hurt yourself or others, or have thoughts about taking your own life, get help right away. You can go to your nearest emergency department or call:  Your local emergency services (911 in the [place]  A suicide crisis helpline, such as the [facility] at 385 015 4947. This is open 24 hours a day. Summary  Taking steps to learn and use tension reduction techniques can help calm you and help prevent triggering an anxiety reaction.  When used together, medicines, psychotherapy, and tension reduction techniques may be the most effective treatment.  Family, friends, and partners can play a big part in helping you recover from an anxiety disorder. This information is not intended to replace advice given to you by your health care provider. Make sure you discuss any questions you have with your health care provider. Document  Revised: 01/18/2019 Document Reviewed: 01/18/2019 Elsevier Patient Education  [employer].   Ask your therapist about cognitive behavioral therapy or treatments for insomnia    Try trazodone  Sleep Hygiene Tips 1) Get regular. One of the best ways to train your body to sleep well is to go to bed and get up at more or less the same time every day, even on weekends and days off! This regular rhythm will make you  feel better and will give your body something to work from. 2) Sleep when sleepy. Only try to sleep when you actually feel tired or sleepy, rather than spending too much time awake in bed. 3) Get up & try again. If you haven't been able to get to sleep after about 20 minutes or more, get up and do something calming or boring until you feel sleepy, then return to bed and try again. Sit quietly on the couch with the lights off (bright light will tell your brain that it is time to wake up), or read something boring like the phone book. Avoid doing anything that is too stimulating or interesting, as this will wake you up even more. 4) Avoid caffeine & nicotine. It is best to avoid consuming any caffeine (in coffee, tea, cola drinks, chocolate, and some medications) or nicotine (cigarettes) for at least 4-6 hours before going to bed. These substances act as stimulants and interfere with the ability to fall asleep 5) Avoid alcohol. It is also best to avoid alcohol for at least 4-6 hours before going to bed. Many people believe that alcohol is relaxing and helps them to get to sleep at first, but it actually interrupts the quality of sleep. 6) Bed is for sleeping. Try not to use your bed for anything other than sleeping and sex, so that your body comes to associate bed with sleep. If you use bed as a place to watch TV, eat, read, work on your laptop, pay bills, and other things, your body will not learn this Connection. 7) No naps. It is best to avoid taking naps during the day, to make sure that you are tired at bedtime. If you can't make it through the day without a nap, make sure it is for less than an hour and before 3pm. 8) Sleep rituals. You can develop your own rituals of things to remind your body that it is time to sleep - some people find it useful to do relaxing stretches or breathing exercises for 15 minutes before bed each night, or sit calmly with a cup of caffeine-free  tea. 9) Bathtime. Having a hot bath 1-2 hours before bedtime can be useful, as it will raise your body temperature, causing you to feel sleepy as your body temperature drops again. Research shows that sleepiness is associated with a drop in body temperature. 10) No clock-watching. Many people who struggle with sleep tend to watch the clock too much. Frequently checking the clock during the night can wake you up (especially if you turn on the light to read the time) and reinforces negative thoughts such as "Oh no, look how late it is, I'll never get to sleep" or "it's so early, I have only slept for 5 hours, this is terrible." 11) Use a sleep diary. This worksheet can be a useful way of making sure you have the right facts about your sleep, rather than making assumptions. Because a diary involves watching the clock (see point 10) it is a good idea to only  use it for two weeks to get an idea of what is going and then perhaps two months down the track to see how you are progressing. 12) Exercise. Regular exercise is a good idea to help with good sleep, but try not to do strenuous exercise in the 4 hours before bedtime. Morning walks are a great way to start the day feeling refreshed! 13) Eat right. A healthy, balanced diet will help you to sleep well, but timing is important. Some people find that a very empty stomach at bedtime is distracting, so it can be useful to have a light snack, but a heavy meal soon before bed can also interrupt sleep. Some people recommend a warm glass of milk, which contains tryptophan, which acts as a natural sleep inducer. 14) The right space. It is very important that your bed and bedroom are quiet and comfortable for sleeping. A cooler room with enough blankets to stay warm is best, and make sure you have curtains or an eyemask to block out early morning light and earplugs if there is noise outside your room. 15) Keep daytime routine the same. Even if  you have a bad night sleep and are tired it is important that you try to keep your daytime activities the same as you had planned. That is, don't avoid activities because you feel tired. This can reinforce the insomnia.

## 2020-01-04 NOTE — Progress Notes (Signed)
Name: Harold Forbes   MRN: [id number]    DOB: October 19, 1970   Date:01/04/2020       Progress Note  Chief Complaint  Patient presents with  . Gastroesophageal Reflux    refill   . Back Pain    wantys to see about getting patch prescribed (diclofenac epolamine) referral  . Insomnia     Subjective:   Harold Forbes is a 49 y.o. male, presents to clinic for routine follow up on the conditions listed above.  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  In the last month he lifted up his kayak and it flared back up.   He would like a referral to a specilaist He also brings in a topical patch medication that he would like to see if he could get prescribed he has diclofenac patches with him  Pain started central low back over time has worsened with radiation across his low back radiates to his buttocks and occasionally will radiate down his legs described as a burning shooting tingling sensation symptoms other times just severe tightness and achy or sharp is exacerbated with any bending over or lifting also exacerbated with palpation the diclofenac patches did help alleviate pain slightly but no other significant alleviating factors.  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  His relationship ended  He does recognize that it may be causing problems at work or in relationships but his only symptoms are usually at night.  He did fill out and complete the GAD-7 GAD 7 : Generalized Anxiety Score 01/04/2020  Nervous, Anxious, on Edge 2  Control/stop worrying 1  Worry too much - different things 1  Trouble relaxing 2  Restless 3  Easily annoyed or irritable 3  Afraid - awful might happen 0  Total GAD 7 Score 12  Anxiety Difficulty Somewhat difficult  Most severe symptoms are restlessness easily irritated or annoyed, and feeling nervous anxious on edge and difficulty relaxing PHQ-9 screening was reviewed and was negative he does not feel particularly depressed although he has had some sad things happen over the last year he did not anticipate to deployments his relationship with his girlfriend and ended suddenly, his dog died and 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  He is speak with a therapist but he is not on any medications and he inquires about medications for anxiety and for insomnia   Depression screen [facility] 2/9 01/04/2020 05/17/2019 01/29/2018  Decreased Interest 0 0 0  Down, Depressed, Hopeless 0 0 1  PHQ - 2 Score 0 0 1  Altered sleeping 0 0 1  Tired, decreased energy 0 0 0  Change in appetite 0 0 0  Feeling bad or failure about yourself  0 0 0  Trouble concentrating 0 0 0  Moving slowly or fidgety/restless 0 0 0  Suicidal thoughts 0 0 0  PHQ-9 Score 0 0 2  Difficult doing work/chores Not difficult at all Not difficult at all Not difficult at all      Patient Active Problem List   Diagnosis Date Noted  . Encounter for screening colonoscopy   . Preventative health care 01/29/2018  . GERD (gastroesophageal reflux disease) 01/29/2018  . Screening for STDs (sexually transmitted diseases) 11/19/2016  . Right elbow tendonitis 11/19/2016  . Annual physical exam 11/19/2015  . Frequent urination 11/19/2015     Past Surgical History:  Procedure Laterality Date  . COLONOSCOPY WITH PROPOFOL N/A 06/27/2019   Procedure: COLONOSCOPY WITH PROPOFOL;  Surgeon: Toney Reil, MD;  Location: [facility] ENDOSCOPY;  Service: Gastroenterology;  Laterality: N/A;  . LASIK Bilateral 2000  . TONSILLECTOMY AND ADENOIDECTOMY      Family History  Problem Relation Age of Onset  . Healthy Mother   . Lung cancer Father   . Cancer Father 29       lung  . Prostate cancer Maternal Uncle     Social History   Tobacco Use  . Smoking status: Never Smoker  . Smokeless tobacco: Never Used  Substance Use Topics  . Alcohol use: Yes    Alcohol/week: 0.0 standard drinks    Comment: occasional  . Drug use: No      Current Outpatient Medications:  .  cyclobenzaprine (FLEXERIL) 10 MG tablet, Take 10 mg by mouth 3 (three) times daily., Disp: , Rfl:  .  famotidine (PEPCID) 20 MG tablet, Take 1 tablet (20 mg total) by mouth 2 (two) times daily as needed for heartburn or indigestion. May take once or twice a day as needed, Disp: 180 tablet, Rfl: 1 .  naproxen (NAPROSYN) 375 MG tablet, Take 375 mg by mouth 2 (two) times daily., Disp: , Rfl:  .  omeprazole (PRILOSEC) 20 MG capsule, Take 1 capsule (20 mg total) by mouth daily., Disp: 90 capsule, Rfl: 1 .  PROAIR HFA 108 (90 Base) MCG/ACT inhaler, , Disp: , Rfl:   No Known Allergies  Chart Review Today: I personally reviewed active problem list, medication list, allergies, family history, social history, health maintenance, notes from last encounter, lab results, imaging with the patient/caregiver today.   Review of Systems  10 Systems reviewed and are negative for acute change except as noted in the HPI.  Objective:    Vitals:   01/04/20 1152  BP: 132/80  Pulse: 85  Resp: 14  Temp: (!) 97.3 F (36.3 C)  SpO2: 98%  Weight: 195 lb 14.4 oz (88.9 kg)  Height: 5\' 8"  (1.727 m)    Body mass index is 29.79 kg/m.  Physical Exam Vitals and nursing note  reviewed.  Constitutional:      General: He is not in acute distress.    Appearance: Normal appearance. He is well-developed. He is not ill-appearing, toxic-appearing or diaphoretic.  HENT:     Head: Normocephalic and atraumatic.     Nose: Nose normal.  Eyes:     General:        Right eye: No discharge.        Left eye: No discharge.     Conjunctiva/sclera: Conjunctivae normal.     Pupils: Pupils are equal, round, and reactive to light.  Neck:     Trachea: No tracheal deviation.  Cardiovascular:     Rate and Rhythm: Normal  rate and regular rhythm.     Pulses: Normal pulses.     Heart sounds: Normal heart sounds.  Pulmonary:     Effort: Pulmonary effort is normal. No respiratory distress.     Breath sounds: Normal breath sounds. No stridor.  Abdominal:     General: Bowel sounds are normal. There is no distension.     Palpations: Abdomen is soft.     Tenderness: There is no abdominal tenderness.  Musculoskeletal:     Cervical back: Normal range of motion and neck supple.     Right lower leg: No edema.     Left lower leg: No edema.     Comments: Lumbar to sacral spinal tenderness to palpation without step-off, no SI joint tenderness, no cervical to thoracic midline tenderness or paraspinal tenderness No paraspinal muscle ttp from cervical to lumbar spine Back-normal rotation bilaterally and extension but decreased flexion he will not bend forward due to severe pain 5/5 strength bilaterally with dorsiflexion, plantarflexion, flexion and extension at knees, and flexion and extension at hips Grossly normal sensation to light touch to bilateral lower extremities Normal gait   Skin:    General: Skin is warm and dry.     Capillary Refill: Capillary refill takes less than 2 seconds.     Coloration: Skin is not pale.     Findings: No rash.     Nails: There is no clubbing.  Neurological:     Mental Status: He is alert.     Sensory: No sensory deficit.     Motor: No tremor or abnormal  muscle tone.     Coordination: Coordination normal.     Gait: Gait normal.  Psychiatric:        Mood and Affect: Mood normal.        Behavior: Behavior normal.       Fall Risk: Fall Risk  01/04/2020 05/17/2019 01/29/2018 12/05/2016 11/19/2015  Falls in the past year? 1 0 No No No  Number falls in past yr: 0 0 - - -  Injury with Fall? 0 0 - - -    Functional Status Survey: Is the patient deaf or have difficulty hearing?: No Does the patient have difficulty seeing, even when wearing glasses/contacts?: Yes Does the patient have difficulty concentrating, remembering, or making decisions?: No Does the patient have difficulty walking or climbing stairs?: No Does the patient have difficulty dressing or bathing?: No Does the patient have difficulty doing errands alone such as visiting a doctor's office or shopping?: No   Assessment & Plan:   1. Gastroesophageal reflux disease, unspecified whether esophagitis present Symptoms stable and well-controlled usually using meds as needed with certain food triggers such as chips and salsa with using steroids for sciatica did encourage him to use Prilosec daily to prevent any gastritis or esophagitis, refill sent 10 - omeprazole (PRILOSEC) 20 MG capsule; Take 1 capsule (20 mg total) by mouth daily.  Dispense: 90 capsule; Refill: 1  2. Chronic midline low back pain with bilateral sciatica Acute on chronic low back pain not improving with physical therapy, NSAIDs, muscle relaxers, patient request diclofenac patches and referral to specialist which I feel is very appropriate.  He states that they did an MRI with the [employer], although I do not have these results available to review.  Today he does have positive straight leg raise bilaterally significant for some radiculopathy/sciatica will treat with a burst of steroids, continue PT and follow-up with Ortho - cyclobenzaprine (FLEXERIL) 10 MG tablet; Take  10 mg by mouth 3 (three) times daily. - naproxen (NAPROSYN)  375 MG tablet; Take 375 mg by mouth 2 (two) times daily. - predniSONE (DELTASONE) 20 MG tablet; Take 2 tablets (40 mg total) by mouth daily with breakfast for 5 days.  Dispense: 10 tablet; Refill: 0 - Ambulatory referral to Spine Surgery - diclofenac (FLECTOR) 1.3 % PTCH; Place 1 patch onto the skin 2 (two) times daily as needed.  Dispense: 60 patch; Refill: 2  3. 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  He has not been on any medications before.  We talked about SSRIs and SNRIs that can treat anxiety but he is currently working with a therapist I did encourage him to talk to his therapist about possibility of starting medications, we will be treating insomnia with trazodone that may help improve some of his sleep, discussed other coping skills and sleep hygiene encouraged him to follow-up in the next month or 2 to reevaluate and start medications if he would like to  4. Insomnia, unspecified type Trial of trazodone, work on sleep hygiene, encouraged him to do cognitive behavioral therapy for insomnia as first-line and effective treatments - traZODone (DESYREL) 50 MG tablet; Take 0.5-1 tablets (25-50 mg total) by mouth at bedtime as needed for sleep.  Dispense: 90 tablet; Refill: 1   Return for 1 month follow up for insomnia and anxiety.   Danelle Berry, PA-C 01/04/20 12:01 PM

## 2020-02-07 ENCOUNTER — Telehealth (INDEPENDENT_AMBULATORY_CARE_PROVIDER_SITE_OTHER): Admitting: Family Medicine

## 2020-02-07 ENCOUNTER — Encounter: Payer: Self-pay | Admitting: Family Medicine

## 2020-02-07 VITALS — Ht 69.0 in | Wt 190.0 lb

## 2020-02-07 DIAGNOSIS — F430 Acute stress reaction: Secondary | ICD-10-CM

## 2020-02-07 DIAGNOSIS — F419 Anxiety disorder, unspecified: Secondary | ICD-10-CM

## 2020-02-07 DIAGNOSIS — G8929 Other chronic pain: Secondary | ICD-10-CM

## 2020-02-07 DIAGNOSIS — K219 Gastro-esophageal reflux disease without esophagitis: Secondary | ICD-10-CM

## 2020-02-07 DIAGNOSIS — G4700 Insomnia, unspecified: Secondary | ICD-10-CM

## 2020-02-07 DIAGNOSIS — M5442 Lumbago with sciatica, left side: Secondary | ICD-10-CM

## 2020-02-07 DIAGNOSIS — M5441 Lumbago with sciatica, right side: Secondary | ICD-10-CM

## 2020-02-07 MED ORDER — BUSPIRONE HCL 5 MG PO TABS: 5.0000 mg | ORAL_TABLET | Freq: Two times a day (BID) | ORAL | 3 refills | Status: DC | PRN

## 2020-02-07 NOTE — Progress Notes (Signed)
Name: Harold Forbes   MRN: [id number]    DOB: [birth year]   Date:02/07/2020       Progress Note  Subjective:    Chief Complaint  Chief Complaint  Patient presents with   Follow-up   Anxiety   Insomnia    I connected with  Orrin Brigham on 02/07/20 at  1:40 PM EDT by telephone and verified that I am speaking with the correct person using two identifiers.  I discussed the limitations, risks, security and privacy concerns of performing an evaluation and management service by telephone and the availability of in person appointments. Staff also discussed with the patient that there may be a patient responsible charge related to this service. 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  His relationship ended  He does recognize that it may be causing problems at work or in relationships but his only symptoms are usually at night.  He did fill out and complete the GAD-7 GAD 7 : Generalized Anxiety Score 01/04/2020  Nervous, Anxious, on Edge 2  Control/stop worrying 1  Worry too much - different things 1  Trouble relaxing 2  Restless 3  Easily annoyed or irritable 3  Afraid - awful might happen 0  Total GAD 7 Score 12  Anxiety Difficulty Somewhat difficult  Most severe symptoms are restlessness easily irritated or annoyed, and feeling nervous anxious on edge and  difficulty relaxing PHQ-9 screening was reviewed and was negative he does not feel particularly depressed although he has had some sad things happen over the last year he did not anticipate to deployments his relationship with his girlfriend and ended suddenly, his dog died and 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  He is speak with a therapist but he is not on any medications and he inquires about medications for anxiety and for insomnia  He was given information about sleep hygiene and given trazodone to try He is established with a therapist and he was encouraged to ask them about cognitive behavioral therapy or treatments for insomnia and for anxiety  He tried 25 mg for about the first week and it helped a little bit but he started to wake up at 3 am.  He increased it to   GAD 7 : Generalized Anxiety Score 02/07/2020 01/04/2020  Nervous, Anxious, on Edge 2 2  Control/stop worrying 2 1  Worry too much - different things 1 1  Trouble relaxing 0 2  Restless 2 3  Easily annoyed or irritable 3 3  Afraid - awful might happen 0 0  Total GAD 7 Score 10 12  Anxiety Difficulty Very difficult Somewhat difficult   Still dealing with a relationship issue and back pain issue  - which he notes are significant sources of stress  GERD-on Prilosec 20 mg daily  [facility] he is still  taking naproxen, Flexeril  For one year of back pain pt has been working with the [place] and currently doing PT, chiropractor, acupuncture  Patient Active Problem List   Diagnosis Date Noted   Encounter for screening colonoscopy    Preventative health care 01/29/2018   GERD (gastroesophageal reflux disease) 01/29/2018   Screening for STDs (sexually transmitted diseases) 11/19/2016   Right elbow tendonitis 11/19/2016   Annual physical exam 11/19/2015   Frequent urination 11/19/2015    Current Outpatient Medications:    cyclobenzaprine (FLEXERIL) 10 MG tablet, Take 10  mg by mouth 3 (three) times daily., Disp: , Rfl:    naproxen (NAPROSYN) 375 MG tablet, Take 375 mg by mouth 2 (two) times daily., Disp: , Rfl:    omeprazole (PRILOSEC) 20 MG capsule, Take 1 capsule (20 mg total) by mouth daily., Disp: 90 capsule, Rfl: 1   PROAIR HFA 108 (90 Base) MCG/ACT inhaler, , Disp: , Rfl:    traZODone (DESYREL) 50 MG tablet, Take 0.5-1 tablets (25-50 mg total) by mouth at bedtime as needed for sleep., Disp: 90 tablet, Rfl: 1 No Known Allergies  Past Surgical History:  Procedure Laterality Date   COLONOSCOPY WITH PROPOFOL N/A 06/27/2019   Procedure: COLONOSCOPY WITH PROPOFOL;  Surgeon: Lin Landsman, MD;  Location: [facility] ENDOSCOPY;  Service: Gastroenterology;  Laterality: N/A;   LASIK Bilateral 2000   TONSILLECTOMY AND ADENOIDECTOMY     Family History  Problem Relation Age of Onset   Healthy Mother    Lung cancer Father    Cancer Father 42       lung   Prostate cancer Maternal Uncle    Social History   Socioeconomic History   Marital status: Single    Spouse name: Not on file   Number of children: 0   Years of education: 12   Highest education level: Master's degree (e.g., MA, MS, MEng, MEd, MSW, MBA)  Occupational History   Not on file  Tobacco Use   Smoking status: Never Smoker   Smokeless tobacco: Never Used  Substance and Sexual Activity   Alcohol use: Yes    Alcohol/week: 0.0 standard drinks    Comment: occasional   Drug use: No   Sexual activity: Yes  Other Topics Concern   Not on file  Social History Narrative   Not on file   Social Determinants of Health   Financial Resource Strain: Low Risk    Difficulty of Paying Living Expenses: Not hard at all  Food Insecurity: No Food Insecurity   Worried About Charity fundraiser in the Last Year: Never true   [place] in the Last Year: Never true  Transportation Needs: No Transportation Needs   Lack of Transportation (Medical): No   Lack of  Transportation (Non-Medical): No  Physical Activity: Sufficiently Active   Days of Exercise per Week: 5 days   Minutes of Exercise per Session: 30 min  Stress: No Stress Concern Present   Feeling of Stress : Not at all  Social Connections: Unknown   Frequency of Communication with Friends and Family: More than three times a week   Frequency of Social Gatherings with Friends and Family: Three times a week   Attends Religious Services: More than 4 times per year   Active Member of Clubs or Organizations: No   Attends Archivist Meetings: Never   Marital Status: Not on file  Intimate Partner Violence: Not At Risk   Fear of Current or Ex-Partner: No  Emotionally Abused: No   Physically Abused: No   Sexually Abused: No    Chart Review Today: I personally reviewed active problem list, medication list, allergies, family history, social history, health maintenance, notes from last encounter, lab results, imaging with the patient/caregiver today.  Review of Systems 10 Systems reviewed and are negative for acute change except as noted in the HPI.   Objective:    Virtual encounter, vitals limited, only able to obtain the following: Today's Vitals   02/07/20 1149  Weight: 190 lb (86.2 kg)  Height: 5\' 9"  (1.753 m)   Body mass index is 28.06 kg/m. Nursing Note and Vital Signs reviewed.  Physical Exam Vitals and nursing note reviewed.  Constitutional:      Appearance: Normal appearance.  Pulmonary:     Effort: No respiratory distress.     Breath sounds: No stridor.  Neurological:     Mental Status: He is alert.  Psychiatric:        Attention and Perception: Attention normal.        Mood and Affect: Mood and affect normal.        Speech: Speech normal.        Behavior: Behavior is cooperative.        Thought Content: Thought content does not include suicidal ideation.     PE limited by telephone encounter   PHQ2/9: Depression screen [facility] 2/9  01/04/2020 05/17/2019 01/29/2018 12/05/2016 11/19/2015  Decreased Interest 0 0 0 0 0  Down, Depressed, Hopeless 0 0 1 0 0  PHQ - 2 Score 0 0 1 0 0  Altered sleeping 0 0 1 - -  Tired, decreased energy 0 0 0 - -  Change in appetite 0 0 0 - -  Feeling bad or failure about yourself  0 0 0 - -  Trouble concentrating 0 0 0 - -  Moving slowly or fidgety/restless 0 0 0 - -  Suicidal thoughts 0 0 0 - -  PHQ-9 Score 0 0 2 - -  Difficult doing work/chores Not difficult at all Not difficult at all Not difficult at all - -   PHQ-2/9 Result is neg, reviewed today  Fall Risk: Fall Risk  02/07/2020 01/04/2020 05/17/2019 01/29/2018 12/05/2016  Falls in the past year? 0 1 0 No No  Number falls in past yr: 0 0 0 - -  Injury with Fall? 0 0 0 - -     Assessment and Plan:   1. Anxiety disorder, unspecified type Patient is still dealing with fairly moderate symptoms pretty frequently and it does seem to continue to affect his interactions, his sleep and day-to-day activity with becoming irritable and agitated, by having excessive worry about things.  After being deployed I explained to him that he may have a change in his brain chemistry causing him to have increased fight or flight response and reaction to things that are more subtle and normal day-to-day interactions.  I do think that he would benefit from a daily medication such as citalopram, Lexapro, Prozac or Zoloft, but he does not want to try one these medications just yet.  Overall he feels pretty good and most of what he deals with is worrying in the middle the night causing insomnia or some interpersonal conflicts with how he is irritable and reacts to things.  Otherwise he reports that he is always been high energy, fidgety, appears high strung although he does not constantly feels stressed anxious or high strung. He was willing to try  BuSpar we discussed its flexible dosing.  He may do well with this as needed throughout the day encouraged him to try and let me  know how it goes  - busPIRone (BUSPAR) 5 MG tablet; Take 1-3 tablets (5-15 mg total) by mouth 2 (two) times daily as needed.  Dispense: 120 tablet; Refill: 3  2. Acute stress reaction See above, patient mostly feels that he is currently dealing [facility] with [facility] and health issues and he is having some difficulty with a relationship-he feels these things are isolated to these 2 sources of stress but encouraged him to be introspective and make sure that if he continues to encounter the same complex in multiple situations with multiple people that he may not be an external source of stress and may be more his anxiety disorder igniting the conflict and daily medicines may be able to easily improve this.  For anxiety disorder unspecified and acute stress reaction he was encouraged to continue to talk to his therapist.  His therapist, he reports, says he is doing everything right. - busPIRone (BUSPAR) 5 MG tablet; Take 1-3 tablets (5-15 mg total) by mouth 2 (two) times daily as needed.  Dispense: 120 tablet; Refill: 3  3. Insomnia, unspecified type Some improvement with sleep, trazodone 25 mg helped him get some sleep but he started to wake up at 3 AM, when he increase the dose to 50 mg at bedtime he felt groggy the next day and continued to wake up about 3 or 4 AM with excessive worrying and thoughts I had encouraged him to ask his therapist if he did CBT for insomnia it does not appear that he has any specific treatments for this.  4. Gastroesophageal reflux disease, unspecified whether esophagitis present Symptoms improved and well controlled on omeprazole  5. Chronic midline low back pain with bilateral sciatica [facility] -sounds like he has multiple treatment modalities including physical therapy, chiropractic, getting approved for acupuncture he is also using medications, does sound a little frustrated with the process.   I discussed the assessment  and treatment plan with the patient. The patient was provided an opportunity to ask questions and all were answered. The patient agreed with the plan and demonstrated an understanding of the instructions.   The patient was advised to call back or seek an in-person evaluation if the symptoms worsen or if the condition fails to improve as anticipated.  I provided 30+ minutes of non-face-to-face time during this encounter, more than 16 min directly with today and additional time to review chart, complete chart documentation, prescribe and arrange medications, after visit summary and follow-up care  Danelle Berry, PA-C 02/07/20 1:51 PM

## 2020-05-29 DIAGNOSIS — E7800 Pure hypercholesterolemia, unspecified: Secondary | ICD-10-CM | POA: Insufficient documentation

## 2020-05-29 NOTE — Progress Notes (Signed)
Name: Harold Forbes   MRN: [id number]    DOB: 1970-09-20   Date:05/30/2020       Progress Note  Subjective  Chief Complaint  Chief Complaint  Patient presents with  . Annual Exam  . Medication Refill    would like Trazadone refilled    HPI  Patient is a 49 y.o. 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 Knows trigger foods.  Anxiety/insomnia - trial of buspar after last visit with [facility] 02/07/20. Still uses prn. Noted 50 mg trazadone works and uses presently, requests refill.   Had a therapist and is still seeing  Hyperlipidemia Lab Results  Component Value Date   CHOL 181 02/03/2018   HDL 48 02/03/2018   LDLCALC 114 (H) 02/03/2018   TRIG 89 02/03/2018   CHOLHDL 3.8 02/03/2018   A1C was higher last year noted   All labs done through the [place] noted last PE and PSA ordered that visit.   USPSTF grade A and B recommendations:  Diet:   notes tries to watch diet and eat healthy,  Exercise:  + regular exercise  Depression: phq 9 is negative Depression screen [facility] 2/9 05/30/2020 01/04/2020 05/17/2019 01/29/2018 12/05/2016  Decreased Interest 0 0 0 0 0  Down, Depressed, Hopeless 0 0 0 1 0  PHQ - 2 Score 0 0 0 1 0  Altered sleeping - 0 0 1 -  Tired, decreased energy - 0 0 0 -  Change in appetite - 0 0 0 -  Feeling bad or failure about yourself  - 0 0 0 -  Trouble concentrating - 0 0 0 -  Moving slowly or fidgety/restless - 0 0 0 -  Suicidal thoughts - 0 0 0 -  PHQ-9 Score - 0 0 2 -   Difficult doing work/chores - Not difficult at all Not difficult at all Not difficult at all -    Hypertension:  BP Readings from Last 3 Encounters:  05/30/20 108/72  01/04/20 132/80  06/27/19 109/75    Obesity: Wt Readings from Last 3 Encounters:  05/30/20 193 lb 12.8 oz (87.9 kg)  02/07/20 190 lb (86.2 kg)  01/04/20 195 lb 14.4 oz (88.9 kg)   BMI Readings from Last 3 Encounters:  05/30/20 29.47 kg/m  02/07/20 28.06 kg/m  01/04/20 29.79 kg/m     Lipids:  Lab Results  Component Value Date   CHOL 181 02/03/2018   CHOL 143 11/19/2016   Lab Results  Component Value Date   HDL 48 02/03/2018   HDL 46 11/19/2016   Lab Results  Component Value Date   LDLCALC 114 (H) 02/03/2018   LDLCALC 80 11/19/2016   Lab Results  Component Value Date   TRIG 89 02/03/2018   TRIG 83 11/19/2016   Lab Results  Component Value Date   CHOLHDL 3.8 02/03/2018   CHOLHDL 3.1 11/19/2016   No results found for: LDLDIRECT  Glucose:  Glucose  Date Value Ref Range  Status  11/22/2015 110 (H) 65 - 99 mg/dL Final   Glucose, Bld  Date Value Ref Range Status  02/03/2018 92 65 - 99 mg/dL Final    Comment:    .            Fasting reference interval .   11/19/2016 89 65 - 99 mg/dL Final      Video Visit from 02/07/2020 in [facility]  AUDIT-C Score 3     Tob - never smoker Alcohol - social, 2-3 times a week, one drink  Single Hep C Screening: [facility] STD testing and prevention (HIV/chl/gon/syphilis): states wants to have done when offered, no symptoms of concern in recent past  Prostate cancer screening: has monitored PSA's in the past, uncle with prostate CA Lab Results  Component Value Date   PSA 0.3 05/17/2019   PSA 0.4 02/03/2018   PSA 0.4 11/19/2016  Denies marked frequency, hesitancy, urgency, hematuria Up 0-1 times a night to urinate Agrees to check PSAs every other year presently to monitor  Skin cancer:  None in the past, denies any  lesions of concern in recent past .  Colorectal cancer screen:  colonoscopy 06/27/2019 - normal, F/u again in 10 years rec'ed  Lung cancer screen: N/A   Low Dose CT Chest recommended if Age 34-80 years, 30 pack-year currently smoking OR have quit w/in 15years.  Patient does not qualify.    AAA:  N/A  The USPSTF recommends one-time screening with ultrasonography in men ages 56 to 49 years who have ever smoked  ECG - no concerning sx's in recent past, no CP, palp's, SOB, increased LE swelling    Advanced Care Planning: A voluntary discussion about advance care planning including the explanation and discussion of advance directives.  Discussed health care proxy and Living will, and the patient was not able to identify a health care proxy today.  Patient does not have a living will at present time. If patient does have living will, I have requested they bring this to the clinic to be scanned in to their chart.  Patient Active Problem List   Diagnosis Date Noted  . Pure hypercholesterolemia 05/29/2020  . Anxiety disorder 02/07/2020  . Acute stress reaction 02/07/2020  . Insomnia 02/07/2020  . 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/07/2020  . Encounter for screening colonoscopy   . Preventative health care 01/29/2018  . Gastroesophageal reflux disease 01/29/2018  . Screening for STDs (sexually transmitted diseases) 11/19/2016  . Right elbow tendonitis 11/19/2016  . Annual physical exam 11/19/2015  . Frequent urination 11/19/2015    Past Surgical History:  Procedure Laterality Date  . COLONOSCOPY WITH PROPOFOL N/A 06/27/2019   Procedure: COLONOSCOPY WITH PROPOFOL;  Surgeon: Toney Reil, MD;  Location: [facility] ENDOSCOPY;  Service: Gastroenterology;  Laterality: N/A;  . LASIK Bilateral 2000  . TONSILLECTOMY AND ADENOIDECTOMY      Family History  Problem Relation Age of Onset  . Healthy Mother   . Lung cancer Father   . Cancer Father 60       lung  .  Prostate cancer Maternal Uncle     Social History   Socioeconomic History  . Marital status: Single    Spouse name: Not on file  . Number of children: 0  . Years of education: 9  . Highest education level: Master's degree (e.g., MA, MS, MEng, MEd, MSW, MBA)  Occupational History  . Not on file  Tobacco Use  .  Smoking status: Never Smoker  . Smokeless tobacco: Never Used  Vaping Use  . Vaping Use: Never used  Substance and Sexual Activity  . Alcohol use: Yes    Alcohol/week: 0.0 standard drinks    Comment: occasional  . Drug use: No  . Sexual activity: Yes  Other Topics Concern  . Not on file  Social History Narrative  . Not on file   Social Determinants of Health   Financial Resource Strain:   . Difficulty of Paying Living Expenses: Not on file  Food Insecurity:   . Worried About Programme researcher, broadcasting/film/video in the Last Year: Not on file  . Ran Out of Food in the Last Year: Not on file  Transportation Needs:   . Lack of Transportation (Medical): Not on file  . Lack of Transportation (Non-Medical): Not on file  Physical Activity:   . Days of Exercise per Week: Not on file  . Minutes of Exercise per Session: Not on file  Stress:   . Feeling of Stress : Not on file  Social Connections:   . Frequency of Communication with Friends and Family: Not on file  . Frequency of Social Gatherings with Friends and Family: Not on file  . Attends Religious Services: Not on file  . Active Member of Clubs or Organizations: Not on file  . Attends Banker Meetings: Not on file  . Marital Status: Not on file  Intimate Partner Violence:   . Fear of Current or Ex-Partner: Not on file  . Emotionally Abused: Not on file  . Physically Abused: Not on file  . Sexually Abused: Not on file     Current Outpatient Medications:  .  busPIRone (BUSPAR) 5 MG tablet, Take 1-3 tablets (5-15 mg total) by mouth 2 (two) times daily as needed., Disp: 120 tablet, Rfl: 3 .  cyclobenzaprine  (FLEXERIL) 10 MG tablet, Take 10 mg by mouth 3 (three) times daily., Disp: , Rfl:  .  naproxen (NAPROSYN) 375 MG tablet, Take 375 mg by mouth 2 (two) times daily., Disp: , Rfl:  .  omeprazole (PRILOSEC) 20 MG capsule, Take 1 capsule (20 mg total) by mouth daily., Disp: 90 capsule, Rfl: 1 .  PROAIR HFA 108 (90 Base) MCG/ACT inhaler, , Disp: , Rfl:  .  traZODone (DESYREL) 50 MG tablet, Take 0.5-1 tablets (25-50 mg total) by mouth at bedtime as needed for sleep., Disp: 90 tablet, Rfl: 1  No Known Allergies   ROS: As noted above in HPI denies any recent unintentional weight loss,  No recent fevers  No increase headaches, vision changes No CP, palpitations,  No increased SOB,   No persistent abdominal pains or change in bowel habits,  No rectal bleeding or dark/black stools,  No flank pains or dysuria,  No frequency or urgency No lower extremity swelling,  No increase joint aches or muscle aches,+ back pain chronically  No numbness, tingling or weakness in the extremities Denies other specific complaints on systems review except as noted in HPI    Objective  Vitals:   05/30/20 1033  BP: 108/72  Pulse: 98  Resp: 16  Temp: 98 F (36.7 C)  TempSrc: Oral  SpO2: 98%  Weight: 193 lb 12.8 oz (87.9 kg)  Height: 5\' 8"  (1.727 m)    Body mass index is 29.47 kg/m.  NAD, masked, pleasant HEENT - sclera anicteric, PERRL, positive glasses, EOMI, conj - non-inj'ed, TM's and canals clear, pharynx clear Neck - supple, no adenopathy, no TM,  carotids 2+ and = without bruits bilat Car - RRR without m/g/r Pulm- CTA without wheeze or rales Abd - soft, NT, ND, BS+, no obvious HSM, no masses Back - no CVA tenderness, + spinal tenderness in the lumbar region Skin- no new lesions of concern or rash on exposed areas, denied otherwise Ext - no LE edema, no active joints GU -deferred as he denied any concerning symptoms, no rash or lumps of concern in the testicular region when asked Rectal: not  done, prostate exam deferred (without concerning sx's and after discussion on current recommendations for prostate CA screening including PSA tests) Neuro - affect was not flat, appropriate with conversation  Grossly non-focal with good strength on testing, sensation intact to LT in distal extremities, DTR's 2+ and = patella, Romberg neg, no pronator drift, good balance on one foot, good finger to nose, good tandem walk   No results found for this or any previous visit (from the past 2160 hour(s)).   PHQ2/9: Depression screen [facility] 2/9 05/30/2020 01/04/2020 05/17/2019 01/29/2018 12/05/2016  Decreased Interest 0 0 0 0 0  Down, Depressed, Hopeless 0 0 0 1 0  PHQ - 2 Score 0 0 0 1 0  Altered sleeping - 0 0 1 -  Tired, decreased energy - 0 0 0 -  Change in appetite - 0 0 0 -  Feeling bad or failure about yourself  - 0 0 0 -  Trouble concentrating - 0 0 0 -  Moving slowly or fidgety/restless - 0 0 0 -  Suicidal thoughts - 0 0 0 -  PHQ-9 Score - 0 0 2 -  Difficult doing work/chores - Not difficult at all Not difficult at all Not difficult at all -    Fall Risk: Fall Risk  05/30/2020 02/07/2020 01/04/2020 05/17/2019 01/29/2018  Falls in the past year? 0 0 1 0 No  Number falls in past yr: 0 0 0 0 -  Injury with Fall? 0 0 0 0 -  Follow up Falls evaluation completed - - - -     Functional Status Survey: Is the patient deaf or have difficulty hearing?: No Does the patient have difficulty seeing, even when wearing glasses/contacts?: No Does the patient have difficulty concentrating, remembering, or making decisions?: No Does the patient have difficulty walking or climbing stairs?: No Does the patient have difficulty dressing or bathing?: No Does the patient have difficulty doing errands alone such as visiting a doctor's office or shopping?: No    Assessment & Plan  General Health/Health Maintenance:   -Prostate cancer screening and PSA options (with potential risks and benefits of testing vs not  testing) were discussed along with recent recs/guidelines.  -USPSTF grade A and B recommendations reviewed with patient; age-appropriate recommendations, preventive care, screening tests, etc discussed and encouraged; healthy living encouraged; see AVS for any further patient education given to patient  -Discussed importance of regular physical activity weekly, which she does not presently  -Discussed importance of eating healthy diet    -Reviewed Health Maintenance:  Adult vaccines due  Topic Date Due  . TETANUS/TDAP  08/01/2028     1. Annual physical exam  2. Anxiety disorder, unspecified type 3. Insomnia, unspecified type Uses BuSpar as needed, and the trazodone nightly which is very helpful for sleep.  He requested a refill of the trazodone and that was done today. Also continues to see a therapist to help.  4. Gastroesophageal reflux disease, unspecified whether esophagitis present Using Pepcid twice daily presently, and  has omeprazole to use as needed, noted when he has certain trigger foods is when he uses the omeprazole in addition.  5. Chronic midline low back pain with bilateral sciatica [facility], has chiropractor, acupuncturist, and physical therapy involved.  6. Pure hypercholesterolemia Higher LDL noted on last labs in epic that were reviewed. We will recheck the lipid panel with labs presently  7.  STD screening We will check a urine for GC and chlamydia, also an RPR and HIV  8.  He noted his A1c was elevated last year on check We will add an A1c to his current labs.   He was not fasting this morning, and will return fasting to have blood drawn for the labs, although can get a urine sample today for the STD screens. Noted will follow up when the lab results return. Tentatively schedule a follow-up in a years time, follow-up sooner as needed.  Marguerite Olea, MD

## 2020-05-30 ENCOUNTER — Other Ambulatory Visit: Payer: Self-pay

## 2020-05-30 ENCOUNTER — Encounter: Admitting: Internal Medicine

## 2020-05-30 ENCOUNTER — Ambulatory Visit (INDEPENDENT_AMBULATORY_CARE_PROVIDER_SITE_OTHER): Admitting: Internal Medicine

## 2020-05-30 ENCOUNTER — Encounter: Payer: Self-pay | Admitting: Internal Medicine

## 2020-05-30 ENCOUNTER — Other Ambulatory Visit (HOSPITAL_COMMUNITY)
Admission: RE | Admit: 2020-05-30 | Discharge: 2020-05-30 | Disposition: A | Discharge status: Home / Self Care | Source: Ambulatory Visit | Attending: Internal Medicine | Admitting: Internal Medicine

## 2020-05-30 VITALS — BP 108/72 | HR 98 | Temp 98.0°F | Resp 16 | Ht 68.0 in | Wt 193.8 lb

## 2020-05-30 DIAGNOSIS — E7800 Pure hypercholesterolemia, unspecified: Secondary | ICD-10-CM

## 2020-05-30 DIAGNOSIS — F419 Anxiety disorder, unspecified: Secondary | ICD-10-CM

## 2020-05-30 DIAGNOSIS — G4700 Insomnia, unspecified: Secondary | ICD-10-CM

## 2020-05-30 DIAGNOSIS — M5441 Lumbago with sciatica, right side: Secondary | ICD-10-CM

## 2020-05-30 DIAGNOSIS — M5442 Lumbago with sciatica, left side: Secondary | ICD-10-CM

## 2020-05-30 DIAGNOSIS — Z23 Encounter for immunization: Secondary | ICD-10-CM | POA: Diagnosis not present

## 2020-05-30 DIAGNOSIS — Z113 Encounter for screening for infections with a predominantly sexual mode of transmission: Secondary | ICD-10-CM

## 2020-05-30 DIAGNOSIS — Z0000 Encounter for general adult medical examination without abnormal findings: Secondary | ICD-10-CM | POA: Diagnosis not present

## 2020-05-30 DIAGNOSIS — Z131 Encounter for screening for diabetes mellitus: Secondary | ICD-10-CM

## 2020-05-30 DIAGNOSIS — K219 Gastro-esophageal reflux disease without esophagitis: Secondary | ICD-10-CM

## 2020-05-30 DIAGNOSIS — G8929 Other chronic pain: Secondary | ICD-10-CM

## 2020-05-30 MED ORDER — TRAZODONE HCL 50 MG PO TABS: 50.0000 mg | ORAL_TABLET | Freq: Every evening | ORAL | 1 refills | Status: AC | PRN

## 2020-05-30 NOTE — Patient Instructions (Signed)

## 2020-05-31 LAB — CYTOLOGY, (ORAL, ANAL, URETHRAL) ANCILLARY ONLY
Chlamydia: NEGATIVE
Comment: NEGATIVE
Comment: NORMAL
Neisseria Gonorrhea: NEGATIVE

## 2020-06-08 LAB — LIPID PANEL
Cholesterol: 165 mg/dL (ref <200)
HDL: 34 mg/dL — ABNORMAL LOW (ref >=40)
LDL Cholesterol (Calc): 96 mg/dL (calc)
Non-HDL Cholesterol (Calc): 131 mg/dL (calc) — ABNORMAL HIGH (ref <130)
Total CHOL/HDL Ratio: 4.9 (calc) (ref <5.0)
Triglycerides: 230 mg/dL — ABNORMAL HIGH (ref <150)

## 2020-06-08 LAB — COMPLETE METABOLIC PANEL WITH GFR
AG Ratio: 1.9 (calc) (ref 1.0–2.5)
ALT: 26 U/L (ref 9–46)
AST: 23 U/L (ref 10–35)
Albumin: 4.1 g/dL (ref 3.6–5.1)
Alkaline phosphatase (APISO): 47 U/L (ref 35–144)
BUN: 13 mg/dL (ref 7–25)
CO2: 24 mmol/L (ref 20–32)
Calcium: 9.3 mg/dL (ref 8.6–10.3)
Chloride: 105 mmol/L (ref 98–110)
Creat: 1.32 mg/dL (ref 0.70–1.33)
GFR, Est African American: 72 mL/min/{1.73_m2} (ref >=60)
GFR, Est Non African American: 62 mL/min/{1.73_m2} (ref >=60)
Globulin: 2.2 g/dL (calc) (ref 1.9–3.7)
Glucose, Bld: 109 mg/dL — ABNORMAL HIGH (ref 65–99)
Potassium: 4.4 mmol/L (ref 3.5–5.3)
Sodium: 139 mmol/L (ref 135–146)
Total Bilirubin: 0.6 mg/dL (ref 0.2–1.2)
Total Protein: 6.3 g/dL (ref 6.1–8.1)

## 2020-06-08 LAB — HEMOGLOBIN A1C
Hgb A1c MFr Bld: 5.7 % of total Hgb — ABNORMAL HIGH (ref <5.7)
Mean Plasma Glucose: 117 (calc)
eAG (mmol/L): 6.5 (calc)

## 2020-06-08 LAB — CBC WITH DIFFERENTIAL/PLATELET
Absolute Monocytes: 528 cells/uL (ref 200–950)
Basophils Absolute: 79 cells/uL (ref 0–200)
Basophils Relative: 1.2 %
Eosinophils Absolute: 416 cells/uL (ref 15–500)
Eosinophils Relative: 6.3 %
HCT: 42.6 % (ref 38.5–50.0)
Hemoglobin: 14.2 g/dL (ref 13.2–17.1)
Lymphs Abs: 1478 cells/uL (ref 850–3900)
MCH: 28.5 pg (ref 27.0–33.0)
MCHC: 33.3 g/dL (ref 32.0–36.0)
MCV: 85.5 fL (ref 80.0–100.0)
MPV: 11.1 fL (ref 7.5–12.5)
Monocytes Relative: 8 %
Neutro Abs: 4099 cells/uL (ref 1500–7800)
Neutrophils Relative %: 62.1 %
Platelets: 307 10*3/uL (ref 140–400)
RBC: 4.98 10*6/uL (ref 4.20–5.80)
RDW: 13.2 % (ref 11.0–15.0)
Total Lymphocyte: 22.4 %
WBC: 6.6 10*3/uL (ref 3.8–10.8)

## 2020-06-08 LAB — TSH: TSH: 1.41 mIU/L (ref 0.40–4.50)

## 2020-06-08 LAB — HIV ANTIBODY (ROUTINE TESTING W REFLEX): HIV 1&2 Ab, 4th Generation: NONREACTIVE

## 2020-06-08 LAB — RPR: RPR Ser Ql: NONREACTIVE

## 2020-10-26 ENCOUNTER — Telehealth: Payer: Self-pay | Admitting: Family Medicine

## 2020-10-26 NOTE — Telephone Encounter (Signed)
Will he need an appt? Or should he go to health dept?

## 2020-10-26 NOTE — Telephone Encounter (Signed)
Spoke with pt and gave him the information to [facility] ([facility] [phone] is there contact number and they are located in [place]

## 2020-10-26 NOTE — Telephone Encounter (Signed)
Pt will be going to [place] on 12-14-2020 and return back on 4-21 or 12-21-2020 and would like to know if leisa would prescribe him some malaria pills. [facility]  [address] in [place] phone number [phone]

## 2020-10-26 NOTE — Telephone Encounter (Signed)
Patient would need appt for rx or will need to go to health dept

## 2021-03-26 ENCOUNTER — Other Ambulatory Visit: Payer: Self-pay

## 2021-03-26 ENCOUNTER — Ambulatory Visit (LOCAL_COMMUNITY_HEALTH_CENTER): Payer: Self-pay

## 2021-03-26 DIAGNOSIS — Z111 Encounter for screening for respiratory tuberculosis: Secondary | ICD-10-CM

## 2021-03-29 ENCOUNTER — Ambulatory Visit (LOCAL_COMMUNITY_HEALTH_CENTER): Payer: Self-pay

## 2021-03-29 ENCOUNTER — Other Ambulatory Visit: Payer: Self-pay

## 2021-03-29 DIAGNOSIS — Z111 Encounter for screening for respiratory tuberculosis: Secondary | ICD-10-CM

## 2021-03-29 LAB — TB SKIN TEST
Induration: 0 mm
TB Skin Test: NEGATIVE

## 2021-04-15 ENCOUNTER — Other Ambulatory Visit: Payer: Self-pay | Admitting: Family Medicine

## 2021-04-15 DIAGNOSIS — F430 Acute stress reaction: Secondary | ICD-10-CM

## 2021-04-15 DIAGNOSIS — F419 Anxiety disorder, unspecified: Secondary | ICD-10-CM

## 2021-04-15 NOTE — Telephone Encounter (Signed)
Medication: busPIRone (BUSPAR) 5 MG tablet   No refills were avaiable at pharmacy due to being out of date   Please send to [facility] [address] ([address]), [place] - [address]  [address] [place] ([place]) [place] [address]  Phone:  [phone]  Fax:  [phone]

## 2021-04-15 NOTE — Telephone Encounter (Signed)
Requested medication (s) are due for refill today: Yes  Requested medication (s) are on the active medication list: Yes  Last refill:  02/07/20  Future visit scheduled: Yes  Notes to clinic:  Unable to refill per protocol, Rx expired.      Requested Prescriptions  Pending Prescriptions Disp Refills   busPIRone (BUSPAR) 5 MG tablet 120 tablet 3    Sig: Take 1-3 tablets (5-15 mg total) by mouth 2 (two) times daily as needed.     Psychiatry: Anxiolytics/Hypnotics - Non-controlled Failed - 04/15/2021 10:36 AM      Failed - Valid encounter within last 6 months    Recent Outpatient Visits           10 months ago Annual physical exam   [facility] Jamelle Haring, MD   1 year ago Anxiety disorder, unspecified type   [facility] Danelle Berry, PA-C   1 year ago Chronic midline low back pain with bilateral sciatica   [facility] Danelle Berry, PA-C   1 year ago General medical examination   [facility] Danelle Berry, PA-C   3 years ago Preventative health care   [facility] Lada, Janit Bern, MD       Future Appointments             In 1 month Danelle Berry, PA-C [facility], [facility]

## 2021-04-15 NOTE — Telephone Encounter (Signed)
Next appt is 05/31/21

## 2021-04-16 MED ORDER — BUSPIRONE HCL 5 MG PO TABS: 5.0000 mg | ORAL_TABLET | Freq: Two times a day (BID) | ORAL | 0 refills | Status: DC | PRN

## 2021-05-31 ENCOUNTER — Other Ambulatory Visit: Payer: Self-pay

## 2021-05-31 ENCOUNTER — Other Ambulatory Visit: Payer: Self-pay | Admitting: Family Medicine

## 2021-05-31 ENCOUNTER — Encounter: Payer: Self-pay | Admitting: Family Medicine

## 2021-05-31 ENCOUNTER — Ambulatory Visit (INDEPENDENT_AMBULATORY_CARE_PROVIDER_SITE_OTHER): Admitting: Family Medicine

## 2021-05-31 ENCOUNTER — Other Ambulatory Visit (HOSPITAL_COMMUNITY)
Admission: RE | Admit: 2021-05-31 | Discharge: 2021-05-31 | Disposition: A | Discharge status: Home / Self Care | Source: Ambulatory Visit | Attending: Family Medicine | Admitting: Family Medicine

## 2021-05-31 ENCOUNTER — Telehealth: Payer: Self-pay | Admitting: Family Medicine

## 2021-05-31 VITALS — BP 112/82 | HR 79 | Temp 98.0°F | Resp 16 | Ht 68.0 in | Wt 193.5 lb

## 2021-05-31 DIAGNOSIS — Z1159 Encounter for screening for other viral diseases: Secondary | ICD-10-CM

## 2021-05-31 DIAGNOSIS — F419 Anxiety disorder, unspecified: Secondary | ICD-10-CM

## 2021-05-31 DIAGNOSIS — G4700 Insomnia, unspecified: Secondary | ICD-10-CM

## 2021-05-31 DIAGNOSIS — Z0000 Encounter for general adult medical examination without abnormal findings: Secondary | ICD-10-CM

## 2021-05-31 DIAGNOSIS — E7800 Pure hypercholesterolemia, unspecified: Secondary | ICD-10-CM

## 2021-05-31 DIAGNOSIS — F430 Acute stress reaction: Secondary | ICD-10-CM

## 2021-05-31 DIAGNOSIS — Z113 Encounter for screening for infections with a predominantly sexual mode of transmission: Secondary | ICD-10-CM | POA: Insufficient documentation

## 2021-05-31 DIAGNOSIS — K219 Gastro-esophageal reflux disease without esophagitis: Secondary | ICD-10-CM

## 2021-05-31 DIAGNOSIS — Z125 Encounter for screening for malignant neoplasm of prostate: Secondary | ICD-10-CM

## 2021-05-31 DIAGNOSIS — R7303 Prediabetes: Secondary | ICD-10-CM

## 2021-05-31 MED ORDER — BUSPIRONE HCL 5 MG PO TABS: 5.0000 mg | ORAL_TABLET | Freq: Two times a day (BID) | ORAL | 3 refills | Status: DC | PRN

## 2021-05-31 MED ORDER — DAYVIGO 10 MG PO TABS: 10.0000 mg | ORAL_TABLET | Freq: Every evening | ORAL | 5 refills | Status: DC | PRN

## 2021-05-31 NOTE — Progress Notes (Signed)
Patient: Harold Forbes, Male    DOB: Jan 08, 1971, 50 y.o.   MRN: [id number] Harold Berry, PA-C Visit Date: 05/31/2021  Today's Provider: Danelle Berry, PA-C   Chief Complaint  Patient presents with   Annual Exam   Subjective:   Annual physical exam:  Harold Forbes is a 50 y.o. male who presents today for health maintenance and annual & complete physical exam.   Exercise/Activity:   active Diet/nutrition:  cutting out sugar, with A1C high  Sleep:  poor sleep   SDOH Screenings   Alcohol Screen: Low Risk    Last Alcohol Screening Score (AUDIT): 2  Depression (PHQ2-9): Low Risk    PHQ-2 Score: 0  Financial Resource Strain: Low Risk    Difficulty of Paying Living Expenses: Not hard at all  Food Insecurity: No Food Insecurity   Worried About Programme researcher, broadcasting/film/video in the Last Year: Never true   Ran Out of Food in the Last Year: Never true  Housing: Not on file  Physical Activity: Sufficiently Active   Days of Exercise per Week: 5 days   Minutes of Exercise per Session: 60 min  Social Connections: Unknown   Frequency of Communication with Friends and Family: Twice a week   Frequency of Social Gatherings with Friends and Family: Once a week   Attends Religious Services: Not on Scientist, clinical (histocompatibility and immunogenetics) or Organizations: No   Attends Banker Meetings: Never   Marital Status: Never married  Stress: Stress Concern Present   Feeling of Stress : To some extent  Tobacco Use: Low Risk    Smoking Tobacco Use: Never   Smokeless Tobacco Use: Never  Transportation Needs: No Transportation Needs   Lack of Transportation (Medical): No   Lack of Transportation (Non-Medical): No      USPSTF grade A and B recommendations - reviewed and addressed today  Depression:  Phq 9 completed today by patient, was reviewed by me with patient in the room, score is  negative, pt feels good Depression screen [facility] 2/9 05/31/2021 05/30/2020 01/04/2020  Decreased Interest 0 0 0  Down,  Depressed, Hopeless 0 0 0  PHQ - 2 Score 0 0 0  Altered sleeping 0 - 0  Tired, decreased energy 0 - 0  Change in appetite 0 - 0  Feeling bad or failure about yourself  0 - 0  Trouble concentrating 0 - 0  Moving slowly or fidgety/restless 0 - 0  Suicidal thoughts 0 - 0  PHQ-9 Score 0 - 0  Difficult doing work/chores Not difficult at all - Not difficult at all    Hep C Screening: screening due  STD testing and prevention (HIV/chl/gon/syphilis): HIV and RPR done last year, other stds not done, multiple male sexual partners with use of condoms - will screen again today  Intimate partner violence:  safe  Advanced Care Planning:  A voluntary discussion about advance care planning including the explanation and discussion of advance directives.  Discussed health care proxy and Living will, and the patient was able to identify a health care proxy as ---  not designated.  Patient does not have a living will at present time. If patient does have living will, I have requested they bring this to the clinic to be scanned in to their chart.  Health Maintenance  Topic Date Due   Hepatitis C Screening  Never done   TETANUS/TDAP  08/01/2028   COLONOSCOPY (Pts 45-51yrs Insurance coverage will need to be confirmed)  06/26/2029   INFLUENZA VACCINE  Completed   COVID-19 Vaccine  Completed   HIV Screening  Completed   Zoster Vaccines- Shingrix  Completed   HPV VACCINES  Aged Out    Skin cancer:   last skin survey was.  Pt reports no hx of skin cancer, suspicious lesions/biopsies in the past.  Colorectal cancer:  colonoscopy is done, 10 year f/up Pt denies   Prostate cancer:  Prostate cancer screening with PSA: Discussed risks and benefits of PSA testing and provided handout. Pt wishes to have PSA drawn today. Lab Results  Component Value Date   PSA 0.3 05/17/2019   PSA 0.4 02/03/2018   PSA 0.4 11/19/2016    Urinary Symptoms:   LUTS very variable depending on foods, drink etc  IPSS     Row  Name 05/31/21 6503         International Prostate Symptom Score   How often have you had the sensation of not emptying your bladder? Not at All     How often have you had to urinate less than every two hours? Less than half the time     How often have you found you stopped and started again several times when you urinated? Less than 1 in 5 times     How often have you found it difficult to postpone urination? About half the time     How often have you had a weak urinary stream? Not at All     How often have you had to strain to start urination? Not at All     How many times did you typically get up at night to urinate? 2 Times     Total IPSS Score 8           Quality of Life due to urinary symptoms   If you were to spend the rest of your life with your urinary condition just the way it is now how would you feel about that? Mixed              Lung cancer:   Low Dose CT Chest recommended if Age 52-80 years, 20 pack-year currently smoking OR have quit w/in 15years. Patient does not qualify.   Social History   Tobacco Use   Smoking status: Never   Smokeless tobacco: Never  Substance Use Topics   Alcohol use: Yes    Alcohol/week: 0.0 standard drinks    Comment: occasional     Alcohol screening: Flowsheet Row Office Visit from 05/31/2021 in [facility]  AUDIT-C Score 2       AAA: n/a The USPSTF recommends one-time screening with ultrasonography in men ages 57 to 35 years who have ever smoked  ECG:  none indicated  Blood pressure/Hypertension: BP Readings from Last 3 Encounters:  05/31/21 112/82  05/30/20 108/72  01/04/20 132/80   Weight/Obesity: Wt Readings from Last 3 Encounters:  05/31/21 193 lb 8 oz (87.8 kg)  05/30/20 193 lb 12.8 oz (87.9 kg)  02/07/20 190 lb (86.2 kg)   BMI Readings from Last 3 Encounters:  05/31/21 29.42 kg/m  05/30/20 29.47 kg/m  02/07/20 28.06 kg/m    Lipids:  Lab Results  Component Value Date   CHOL 165  06/07/2020   CHOL 181 02/03/2018   CHOL 143 11/19/2016   Lab Results  Component Value Date   HDL 34 (L) 06/07/2020   HDL 48 02/03/2018   HDL 46 11/19/2016   Lab Results  Component Value Date  LDLCALC 96 06/07/2020   LDLCALC 114 (H) 02/03/2018   LDLCALC 80 11/19/2016   Lab Results  Component Value Date   TRIG 230 (H) 06/07/2020   TRIG 89 02/03/2018   TRIG 83 11/19/2016   Lab Results  Component Value Date   CHOLHDL 4.9 06/07/2020   CHOLHDL 3.8 02/03/2018   CHOLHDL 3.1 11/19/2016   No results found for: LDLDIRECT Based on the results of lipid panel his/her cardiovascular risk factor ( using [facility] )  in the next 10 years is : The 10-year ASCVD risk score (Arnett DK, et al., 2019) is: 8.3%*   Values used to calculate the score:     Age: 25 years     Sex: Male     Is Non-Hispanic African American: No     Diabetic: No     Tobacco smoker: Yes     Systolic Blood Pressure: 112 mmHg     Is BP treated: No     HDL Cholesterol: 34 mg/dL     Total Cholesterol: 172 mg/dL*     * - Cholesterol units were assumed for this score calculation Glucose:  Glucose, Bld  Date Value Ref Range Status  06/07/2020 109 (H) 65 - 99 mg/dL Final    Comment:    .            Fasting reference interval . For someone without known diabetes, a glucose value between 100 and 125 mg/dL is consistent with prediabetes and should be confirmed with a follow-up test. .   02/03/2018 92 65 - 99 mg/dL Final    Comment:    .            Fasting reference interval .   11/19/2016 89 65 - 99 mg/dL Final    Social History       Social History   Socioeconomic History   Marital status: Single    Spouse name: Not on file   Number of children: 0   Years of education: 12   Highest education level: Master's degree (e.g., MA, MS, MEng, MEd, MSW, MBA)  Occupational History   Not on file  Tobacco Use   Smoking status: Never   Smokeless tobacco: Never  Vaping Use   Vaping Use: Never used   Substance and Sexual Activity   Alcohol use: Yes    Alcohol/week: 0.0 standard drinks    Comment: occasional   Drug use: No   Sexual activity: Yes    Comment: multiple partners using condoms, male partners  Other Topics Concern   Not on file  Social History Narrative   Not on file   Social Determinants of Health   Financial Resource Strain: Low Risk    Difficulty of Paying Living Expenses: Not hard at all  Food Insecurity: No Food Insecurity   Worried About Programme researcher, broadcasting/film/video in the Last Year: Never true   Barista in the Last Year: Never true  Transportation Needs: No Transportation Needs   Lack of Transportation (Medical): No   Lack of Transportation (Non-Medical): No  Physical Activity: Sufficiently Active   Days of Exercise per Week: 5 days   Minutes of Exercise per Session: 60 min  Stress: Stress Concern Present   Feeling of Stress : To some extent  Social Connections: Unknown   Frequency of Communication with Friends and Family: Twice a week   Frequency of Social Gatherings with Friends and Family: Once a week   Attends Religious Services: Not on  file   Active Member of Clubs or Organizations: No   Attends Banker Meetings: Never   Marital Status: Never married    Family History        Family History  Problem Relation Age of Onset   Healthy Mother    Lung cancer Father    Cancer Father 4       lung   Prostate cancer Maternal Uncle     Patient Active Problem List   Diagnosis Date Noted   Pure hypercholesterolemia 05/29/2020   Anxiety disorder 02/07/2020   Acute stress reaction 02/07/2020   Insomnia 02/07/2020   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/07/2020   Encounter for screening colonoscopy    Preventative health care 01/29/2018   Gastroesophageal reflux disease 01/29/2018   Screening for STDs (sexually transmitted diseases) 11/19/2016   Right elbow tendonitis 11/19/2016   Annual physical exam 11/19/2015    Frequent urination 11/19/2015    Past Surgical History:  Procedure Laterality Date   COLONOSCOPY WITH PROPOFOL N/A 06/27/2019   Procedure: COLONOSCOPY WITH PROPOFOL;  Surgeon: Toney Reil, MD;  Location: [facility] ENDOSCOPY;  Service: Gastroenterology;  Laterality: N/A;   LASIK Bilateral 2000   TONSILLECTOMY AND ADENOIDECTOMY       Current Outpatient Medications:    cyclobenzaprine (FLEXERIL) 10 MG tablet, Take 10 mg by mouth 3 (three) times daily., Disp: , Rfl:    Lemborexant (DAYVIGO) 10 MG TABS, Take 10 mg by mouth at bedtime as needed., Disp: 30 tablet, Rfl: 5   naproxen (NAPROSYN) 375 MG tablet, Take 375 mg by mouth 2 (two) times daily., Disp: , Rfl:    omeprazole (PRILOSEC) 20 MG capsule, Take 1 capsule (20 mg total) by mouth daily., Disp: 90 capsule, Rfl: 1   pantoprazole (PROTONIX) 40 MG tablet, TAKE ONE TABLET BY MOUTH EVERY DAY TO CONTROL STOMACH ACID, Disp: , Rfl:    PROAIR HFA 108 (90 Base) MCG/ACT inhaler, , Disp: , Rfl:    traZODone (DESYREL) 50 MG tablet, Take 1 tablet (50 mg total) by mouth at bedtime as needed for sleep., Disp: 90 tablet, Rfl: 1   busPIRone (BUSPAR) 5 MG tablet, Take 1-3 tablets (5-15 mg total) by mouth 2 (two) times daily as needed., Disp: 120 tablet, Rfl: 3  No Known Allergies  Patient Care Team: Harold Berry, PA-C as PCP - General (Family Medicine)   Chart Review: I personally reviewed active problem list, medication list, allergies, family history, social history, health maintenance, notes from last encounter, lab results, imaging with the patient/caregiver today.   Review of Systems  Constitutional: Negative.   HENT: Negative.    Eyes: Negative.   Respiratory: Negative.    Cardiovascular: Negative.   Gastrointestinal: Negative.   Endocrine: Negative.   Genitourinary: Negative.   Musculoskeletal: Negative.   Skin: Negative.   Allergic/Immunologic: Negative.   Neurological: Negative.   Hematological: Negative.    Psychiatric/Behavioral: Negative.    All other systems reviewed and are negative.        Objective:   Vitals:  Vitals:   05/31/21 0821  BP: 112/82  Pulse: 79  Resp: 16  Temp: 98 F (36.7 C)  TempSrc: Oral  SpO2: 96%  Weight: 193 lb 8 oz (87.8 kg)  Height: 5\' 8"  (1.727 m)    Body mass index is 29.42 kg/m.  Physical Exam Vitals and nursing note reviewed.  Constitutional:      General: He is not in acute distress.    Appearance: Normal  appearance. He is well-developed and well-groomed. He is not ill-appearing, toxic-appearing or diaphoretic.     Interventions: Face mask in place.  HENT:     Head: Normocephalic and atraumatic.     Jaw: No trismus.     Right Ear: Hearing, tympanic membrane, ear canal and external ear normal.     Left Ear: Hearing, tympanic membrane, ear canal and external ear normal.     Nose: Rhinorrhea present. No mucosal edema or congestion. Rhinorrhea is clear.     Mouth/Throat:     Mouth: Mucous membranes are moist.     Pharynx: Oropharynx is clear. Uvula midline.  Eyes:     General: Lids are normal. No scleral icterus.       Right eye: No discharge.        Left eye: No discharge.     Conjunctiva/sclera: Conjunctivae normal.  Neck:     Trachea: Trachea and phonation normal. No tracheal deviation.  Cardiovascular:     Rate and Rhythm: Normal rate and regular rhythm.     Pulses: Normal pulses.          Radial pulses are 2+ on the right side and 2+ on the left side.       Posterior tibial pulses are 2+ on the right side and 2+ on the left side.     Heart sounds: Normal heart sounds. No murmur heard.   No friction rub. No gallop.  Pulmonary:     Effort: Pulmonary effort is normal. No respiratory distress.     Breath sounds: Normal breath sounds. No stridor. No wheezing, rhonchi or rales.  Abdominal:     General: Bowel sounds are normal. There is no distension.     Palpations: Abdomen is soft.     Tenderness: There is no right CVA tenderness or  left CVA tenderness.  Musculoskeletal:     Cervical back: Normal range of motion.     Right lower leg: No edema.     Left lower leg: No edema.  Lymphadenopathy:     Cervical: No cervical adenopathy.  Skin:    General: Skin is warm and dry.     Capillary Refill: Capillary refill takes less than 2 seconds.     Coloration: Skin is not jaundiced or pale.     Findings: No lesion or rash.     Nails: There is no clubbing.  Neurological:     Mental Status: He is alert. Mental status is at baseline.     Cranial Nerves: No dysarthria or facial asymmetry.     Motor: No tremor or abnormal muscle tone.     Gait: Gait normal.  Psychiatric:        Mood and Affect: Mood normal.        Speech: Speech normal.        Behavior: Behavior normal. Behavior is cooperative.     Recent Results (from the past 2160 hour(s))  TB Skin Test     Status: Normal   Collection Time: 03/29/21  8:40 AM  Result Value Ref Range   TB Skin Test Negative    Induration 0 mm    Fall Risk: Fall Risk  05/31/2021 05/30/2020 02/07/2020 01/04/2020 05/17/2019  Falls in the past year? 0 0 0 1 0  Number falls in past yr: 0 0 0 0 0  Injury with Fall? 0 0 0 0 0  Follow up - Falls evaluation completed - - -    Functional Status Survey: Is the patient  deaf or have difficulty hearing?: No Does the patient have difficulty seeing, even when wearing glasses/contacts?: No Does the patient have difficulty concentrating, remembering, or making decisions?: No Does the patient have difficulty walking or climbing stairs?: No Does the patient have difficulty dressing or bathing?: No Does the patient have difficulty doing errands alone such as visiting a doctor's office or shopping?: No   Assessment & Plan:    CPE completed today  Prostate cancer screening and PSA options (with potential risks and benefits of testing vs not testing) were discussed along with recent recs/guidelines, shared decision making and handout/information given to  pt today  USPSTF grade A and B recommendations reviewed with patient; age-appropriate recommendations, preventive care, screening tests, etc discussed and encouraged; healthy living encouraged; see AVS for patient education given to patient  Discussed importance of 150 minutes of physical activity weekly, AHA exercise recommendations given to pt in AVS/handout  Discussed importance of healthy diet:  eating lean meats and proteins, avoiding trans fats and saturated fats, avoid simple sugars and excessive carbs in diet, eat 6 servings of fruit/vegetables daily and drink plenty of water and avoid sweet beverages.  DASH diet reviewed if pt has HTN  Recommended pt to do annual eye exam and routine dental exams/cleanings  Advance Care planning information and packet discussed and offered today, encouraged pt to discuss with family members/spouse/partner/friends and complete Advanced directive packet and bring copy to office   Reviewed Health Maintenance: Health Maintenance  Topic Date Due   Hepatitis C Screening  Never done   TETANUS/TDAP  08/01/2028   COLONOSCOPY (Pts 45-49yrs Insurance coverage will need to be confirmed)  06/26/2029   INFLUENZA VACCINE  Completed   COVID-19 Vaccine  Completed   HIV Screening  Completed   Zoster Vaccines- Shingrix  Completed   HPV VACCINES  Aged Out    Immunizations: Immunization History  Administered Date(s) Administered   Influenza Split 05/20/2016   Influenza,inj,Quad PF,6+ Mos 05/17/2019, 05/30/2020   Influenza-Unspecified 06/02/2015, 05/28/2017   PFIZER(Purple Top)SARS-COV-2 Vaccination 10/24/2019, 11/14/2019, 07/19/2020, 01/03/2021   PPD Test 03/26/2021   Tdap 08/01/2018   Vaccines:  HPV: up to at age 61 , ask insurance if age between 21-45  Shingrix: 40-64 yo and ask insurance if covered when patient above 71 yo Pneumonia:  educated and discussed with patient. Flu:  educated and discussed with patient. COVID:       ICD-10-CM   1. Annual  physical exam  Z00.00 COMPLETE METABOLIC PANEL WITH GFR    CBC with Differential/Platelet    Lipid panel    Hepatitis C antibody    PSA    Hemoglobin A1c    TSH    T4, free    2. Anxiety disorder, unspecified type  F41.9 busPIRone (BUSPAR) 5 MG tablet    TSH   doing therapy, buspar prn, helps when his mind runs and races with thoughts    3. Insomnia, unspecified type  G47.00 TSH    Lemborexant (DAYVIGO) 10 MG TABS   recommended he talk to psychologist about other techniques to help with insomnia, on trazodone, but still having trouble staying asleep, dayvigo rx    4. Gastroesophageal reflux disease, unspecified whether esophagitis present  K21.9    freq sx, [facility]    5. Pure hypercholesterolemia  E78.00 COMPLETE METABOLIC PANEL WITH GFR    Lipid panel    6. Acute stress reaction  F43.0 busPIRone (BUSPAR) 5 MG tablet    7. Need for  hepatitis C screening test  Z11.59 Hepatitis C antibody    8. Screening for prostate cancer  Z12.5 PSA    9. Prediabetes  R73.03 COMPLETE METABOLIC PANEL WITH GFR    Hemoglobin A1c   was 6.2 at [facility] in June working on diet changes, cutting out sugar, weight unchanged, no family hx of DM    10. Screening for STD (sexually transmitted disease)  Z11.3 Urine cytology ancillary only     Return in about 6 months (around 11/28/2021) for insomnia med refill, prediabetes 6 month virtual .      Harold Berry, PA-C 05/31/21 9:23 AM  [facility]

## 2021-05-31 NOTE — Telephone Encounter (Signed)
Pt called stating that his insurance is no longer covering medications from [facility]. He is requesting to have both prescriptions, Dayvigo and Buspirone sent to the [facility] instead. Please advise.     [id number] [name], [place] - [address] AT [facility] OF [address]  [address] [place] [address]  Phone: [phone] Fax: [phone]  Hours: Not open 24 hours

## 2021-05-31 NOTE — Patient Instructions (Signed)
Preventive Care 40-52 Years Old, Male Preventive care refers to lifestyle choices and visits with your health care provider that can promote health and wellness. This includes: A yearly physical exam. This is also called an annual wellness visit. Regular dental and eye exams. Immunizations. Screening for certain conditions. Healthy lifestyle choices, such as: Eating a healthy diet. Getting regular exercise. Not using drugs or products that contain nicotine and tobacco. Limiting alcohol use. What can I expect for my preventive care visit? Physical exam Your health care provider will check your: Height and weight. These may be used to calculate your BMI (body mass index). BMI is a measurement that tells if you are at a healthy weight. Heart rate and blood pressure. Body temperature. Skin for abnormal spots. Counseling Your health care provider may ask you questions about your: Past medical problems. Family's medical history. Alcohol, tobacco, and drug use. Emotional well-being. Home life and relationship well-being. Sexual activity. Diet, exercise, and sleep habits. Work and work environment. Access to firearms. What immunizations do I need? Vaccines are usually given at various ages, according to a schedule. Your health care provider will recommend vaccines for you based on your age, medical history, and lifestyle or other factors, such as travel or where you work. What tests do I need? Blood tests Lipid and cholesterol levels. These may be checked every 5 years, or more often if you are over 50 years old. Hepatitis C test. Hepatitis B test. Screening Lung cancer screening. You may have this screening every year starting at age 55 if you have a 30-pack-year history of smoking and currently smoke or have quit within the past 15 years. Prostate cancer screening. Recommendations will vary depending on your family history and other risks. Genital exam to check for testicular cancer  or hernias. Colorectal cancer screening. All adults should have this screening starting at age 50 and continuing until age 75. Your health care provider may recommend screening at age 45 if you are at increased risk. You will have tests every 1-10 years, depending on your results and the type of screening test. Diabetes screening. This is done by checking your blood sugar (glucose) after you have not eaten for a while (fasting). You may have this done every 1-3 years. STD (sexually transmitted disease) testing, if you are at risk. Follow these instructions at home: Eating and drinking  Eat a diet that includes fresh fruits and vegetables, whole grains, lean protein, and low-fat dairy products. Take vitamin and mineral supplements as recommended by your health care provider. Do not drink alcohol if your health care provider tells you not to drink. If you drink alcohol: Limit how much you have to 0-2 drinks a day. Be aware of how much alcohol is in your drink. In the [place] one drink equals one 12 oz bottle of beer (355 mL), one 5 oz glass of wine (148 mL), or one 1 oz glass of hard liquor (44 mL). Lifestyle Take daily care of your teeth and gums. Brush your teeth every morning and night with fluoride toothpaste. Floss one time each day. Stay active. Exercise for at least 30 minutes 5 or more days each week. Do not use any products that contain nicotine or tobacco, such as cigarettes, e-cigarettes, and chewing tobacco. If you need help quitting, ask your health care provider. Do not use drugs. If you are sexually active, practice safe sex. Use a condom or other form of protection to prevent STIs (sexually transmitted infections). If told by your   health care provider, take low-dose aspirin daily starting at age 50. Find healthy ways to cope with stress, such as: Meditation, yoga, or listening to music. Journaling. Talking to a trusted person. Spending time with friends and  family. Safety Always wear your seat belt while driving or riding in a vehicle. Do not drive: If you have been drinking alcohol. Do not ride with someone who has been drinking. When you are tired or distracted. While texting. Wear a helmet and other protective equipment during sports activities. If you have firearms in your house, make sure you follow all gun safety procedures. What's next? Go to your health care provider once a year for an annual wellness visit. Ask your health care provider how often you should have your eyes and teeth checked. Stay up to date on all vaccines. This information is not intended to replace advice given to you by your health care provider. Make sure you discuss any questions you have with your health care provider. Document Revised: 10/26/2020 Document Reviewed: 08/12/2018 Elsevier Patient Education  2022 [employer]vier Inc.   

## 2021-05-31 NOTE — Addendum Note (Signed)
Addended by: Danelle Berry on: 05/31/2021 01:56 PM   Modules accepted: Orders

## 2021-05-31 NOTE — Telephone Encounter (Signed)
Pt was notified about mediations sent to [facility].

## 2021-06-03 LAB — COMPLETE METABOLIC PANEL WITH GFR
AG Ratio: 1.7 (calc) (ref 1.0–2.5)
ALT: 16 U/L (ref 9–46)
AST: 17 U/L (ref 10–35)
Albumin: 4.3 g/dL (ref 3.6–5.1)
Alkaline phosphatase (APISO): 53 U/L (ref 35–144)
BUN: 17 mg/dL (ref 7–25)
CO2: 25 mmol/L (ref 20–32)
Calcium: 9.3 mg/dL (ref 8.6–10.3)
Chloride: 103 mmol/L (ref 98–110)
Creat: 1.21 mg/dL (ref 0.70–1.30)
Globulin: 2.6 g/dL (calc) (ref 1.9–3.7)
Glucose, Bld: 94 mg/dL (ref 65–99)
Potassium: 4.7 mmol/L (ref 3.5–5.3)
Sodium: 138 mmol/L (ref 135–146)
Total Bilirubin: 1.3 mg/dL — ABNORMAL HIGH (ref 0.2–1.2)
Total Protein: 6.9 g/dL (ref 6.1–8.1)
eGFR: 72 mL/min/{1.73_m2} (ref >=60)

## 2021-06-03 LAB — CBC WITH DIFFERENTIAL/PLATELET
Absolute Monocytes: 770 cells/uL (ref 200–950)
Basophils Absolute: 60 cells/uL (ref 0–200)
Basophils Relative: 0.6 %
Eosinophils Absolute: 390 cells/uL (ref 15–500)
Eosinophils Relative: 3.9 %
HCT: 40.2 % (ref 38.5–50.0)
Hemoglobin: 13.3 g/dL (ref 13.2–17.1)
Lymphs Abs: 1400 cells/uL (ref 850–3900)
MCH: 28.6 pg (ref 27.0–33.0)
MCHC: 33.1 g/dL (ref 32.0–36.0)
MCV: 86.5 fL (ref 80.0–100.0)
MPV: 11 fL (ref 7.5–12.5)
Monocytes Relative: 7.7 %
Neutro Abs: 7380 cells/uL (ref 1500–7800)
Neutrophils Relative %: 73.8 %
Platelets: 325 10*3/uL (ref 140–400)
RBC: 4.65 10*6/uL (ref 4.20–5.80)
RDW: 12.5 % (ref 11.0–15.0)
Total Lymphocyte: 14 %
WBC: 10 10*3/uL (ref 3.8–10.8)

## 2021-06-03 LAB — LIPID PANEL
Cholesterol: 139 mg/dL (ref <200)
HDL: 42 mg/dL (ref >=40)
LDL Cholesterol (Calc): 83 mg/dL (calc)
Non-HDL Cholesterol (Calc): 97 mg/dL (calc) (ref <130)
Total CHOL/HDL Ratio: 3.3 (calc) (ref <5.0)
Triglycerides: 63 mg/dL (ref <150)

## 2021-06-03 LAB — HEPATITIS C ANTIBODY
Hepatitis C Ab: NONREACTIVE
SIGNAL TO CUT-OFF: 0.01 (ref <1.00)

## 2021-06-03 LAB — HEMOGLOBIN A1C
Hgb A1c MFr Bld: 5.5 % of total Hgb (ref <5.7)
Mean Plasma Glucose: 111 mg/dL
eAG (mmol/L): 6.2 mmol/L

## 2021-06-03 LAB — TSH: TSH: 0.72 mIU/L (ref 0.40–4.50)

## 2021-06-03 LAB — T4, FREE: Free T4: 1.2 ng/dL (ref 0.8–1.8)

## 2021-06-03 LAB — PSA: PSA: 0.22 ng/mL (ref <=4.00)

## 2021-06-04 LAB — URINE CYTOLOGY ANCILLARY ONLY
Chlamydia: NEGATIVE
Comment: NEGATIVE
Comment: NEGATIVE
Comment: NORMAL
Neisseria Gonorrhea: NEGATIVE
Trichomonas: NEGATIVE

## 2021-06-18 NOTE — Telephone Encounter (Signed)
PA was done and denied by insurance.  Was placed on Leisa desk for alternatives, but she is out of office

## 2021-06-18 NOTE — Telephone Encounter (Signed)
Pt called to report that his pharmacy needs a PA to refill his Dayvigo, please advise  [facility][id number] [name], [place] - [address] AT [facility] OF [address]  [address] [place] [address]  Phone: [phone] Fax: [phone]  Pt says that he wants this completed by the end of today

## 2021-08-23 ENCOUNTER — Other Ambulatory Visit: Payer: Self-pay

## 2021-08-23 ENCOUNTER — Telehealth: Payer: Self-pay

## 2021-08-23 ENCOUNTER — Other Ambulatory Visit: Payer: Self-pay | Admitting: Family Medicine

## 2021-08-23 DIAGNOSIS — F430 Acute stress reaction: Secondary | ICD-10-CM

## 2021-08-23 DIAGNOSIS — F419 Anxiety disorder, unspecified: Secondary | ICD-10-CM

## 2021-08-23 MED ORDER — BUSPIRONE HCL 5 MG PO TABS: 5.0000 mg | ORAL_TABLET | Freq: Two times a day (BID) | ORAL | 0 refills | Status: AC | PRN

## 2021-08-23 MED ORDER — BUSPIRONE HCL 5 MG PO TABS: 5.0000 mg | ORAL_TABLET | Freq: Two times a day (BID) | ORAL | 0 refills | Status: DC | PRN

## 2021-08-23 NOTE — Telephone Encounter (Signed)
Copied from CRM [id number]. Topic: Quick Communication - Rx Refill/Question >> Aug 23, 2021  9:50 AM Gaetana Michaelis A wrote: Medication: busPIRone (BUSPAR) 5 MG tablet [[id number]]   Has the patient contacted their pharmacy? Yes.   (Agent: If no, request that the patient contact the pharmacy for the refill. If patient does not wish to contact the pharmacy document the reason why and proceed with request.) (Agent: If yes, when and what did the pharmacy advise?)  Preferred Pharmacy (with phone number or street name): [facility] #[id number] [name], [place] - [address] AT [facility] OF [address] [facility]  Phone:  [phone] Fax:  [phone]  Has the patient been seen for an appointment in the last year OR does the patient have an upcoming appointment? Yes.    Agent: Please be advised that RX refills may take up to 3 business days. We ask that you follow-up with your pharmacy.

## 2021-08-23 NOTE — Telephone Encounter (Signed)
Copied from CRM [id number]. Topic: General - Other >> Aug 23, 2021  9:52 AM Gaetana Michaelis A wrote: Reason for CRM: The patient's has called to request an alternative prescription for Lemborexant (DAYVIGO) 10 MG TABS [[id number]]   The patient's insurance will not cover the cost of the medication   Please contact the patient further when possible

## 2021-08-23 NOTE — Telephone Encounter (Signed)
Requested Prescriptions  Pending Prescriptions Disp Refills   busPIRone (BUSPAR) 5 MG tablet 120 tablet 3    Sig: Take 1-3 tablets (5-15 mg total) by mouth 2 (two) times daily as needed.     Psychiatry: Anxiolytics/Hypnotics - Non-controlled Passed - 08/23/2021 11:08 AM      Passed - Valid encounter within last 6 months    Recent Outpatient Visits          2 months ago Annual physical exam   [facility] Danelle Berry, PA-C   1 year ago Annual physical exam   [facility] Jamelle Haring, MD   1 year ago Anxiety disorder, unspecified type   [facility] Danelle Berry, PA-C   1 year ago Chronic midline low back pain with bilateral sciatica   [facility] Danelle Berry, PA-C   2 years ago General medical examination   [facility] Danelle Berry, PA-C      Future Appointments            In 3 months Danelle Berry, PA-C [facility], [facility]

## 2021-08-23 NOTE — Telephone Encounter (Signed)
Correct, prior authorization has already been denied. Pt needs an alternative rx.

## 2021-08-23 NOTE — Telephone Encounter (Signed)
Pt was transferred to Harold Forbes to get him scheduled for a follow up appointment for his insomnia medication.

## 2021-08-30 ENCOUNTER — Encounter: Payer: Self-pay | Admitting: Internal Medicine

## 2021-08-30 ENCOUNTER — Ambulatory Visit (INDEPENDENT_AMBULATORY_CARE_PROVIDER_SITE_OTHER): Admitting: Internal Medicine

## 2021-08-30 VITALS — BP 122/84 | HR 79 | Temp 97.9°F | Resp 16 | Ht 68.0 in | Wt 195.3 lb

## 2021-08-30 DIAGNOSIS — G4700 Insomnia, unspecified: Secondary | ICD-10-CM

## 2021-08-30 DIAGNOSIS — R0683 Snoring: Secondary | ICD-10-CM

## 2021-08-30 MED ORDER — MELATONIN ER 5 MG PO TBCR: 5.0000 mg | EXTENDED_RELEASE_TABLET | Freq: Every evening | ORAL | 3 refills | Status: DC

## 2021-08-30 NOTE — Patient Instructions (Addendum)
It was great seeing you today!  Plan discussed at today's visit: -Continue to take Trazodone 50 mg at night, ok to take half a pill in the middle of the night as needed -Try Melatonin ER 5 mg at bedtime to help you stay asleep throughout the night -Continue to work on anxiety with counseling and Buspar -Referral to Pulmonology for sleep study   Follow up in: 3 months   Take care and let us know if you have any questions or concerns prior to your next visit.  Dr. Caralee Ates   Insomnia Insomnia is a sleep disorder that makes it difficult to fall asleep or stay asleep. Insomnia can cause fatigue, low energy, difficulty concentrating, mood swings, and poor performance at work or school. There are three different ways to classify insomnia: Difficulty falling asleep. Difficulty staying asleep. Waking up too early in the morning. Any type of insomnia can be long-term (chronic) or short-term (acute). Both are common. Short-term insomnia usually lasts for three months or less. Chronic insomnia occurs at least three times a week for longer than three months. What are the causes? Insomnia may be caused by another condition, situation, or substance, such as: Anxiety. Certain medicines. Gastroesophageal reflux disease (GERD) or other gastrointestinal conditions. Asthma or other breathing conditions. Restless legs syndrome, sleep apnea, or other sleep disorders. Chronic pain. Menopause. Stroke. Abuse of alcohol, tobacco, or illegal drugs. Mental health conditions, such as depression. Caffeine. Neurological disorders, such as Alzheimer's disease. An overactive thyroid (hyperthyroidism). Sometimes, the cause of insomnia may not be known. What increases the risk? Risk factors for insomnia include: Gender. Women are affected more often than men. Age. Insomnia is more common as you get older. Stress. Lack of exercise. Irregular work schedule or working night shifts. Traveling between  different time zones. Certain medical and mental health conditions. What are the signs or symptoms? If you have insomnia, the main symptom is having trouble falling asleep or having trouble staying asleep. This may lead to other symptoms, such as: Feeling fatigued or having low energy. Feeling nervous about going to sleep. Not feeling rested in the morning. Having trouble concentrating. Feeling irritable, anxious, or depressed. How is this diagnosed? This condition may be diagnosed based on: Your symptoms and medical history. Your health care provider may ask about: Your sleep habits. Any medical conditions you have. Your mental health. A physical exam. How is this treated? Treatment for insomnia depends on the cause. Treatment may focus on treating an underlying condition that is causing insomnia. Treatment may also include: Medicines to help you sleep. Counseling or therapy. Lifestyle adjustments to help you sleep better. Follow these instructions at home: Eating and drinking Limit or avoid alcohol, caffeinated beverages, and cigarettes, especially close to bedtime. These can disrupt your sleep. Do not eat a large meal or eat spicy foods right before bedtime. This can lead to digestive discomfort that can make it hard for you to sleep. Sleep habits Keep a sleep diary to help you and your health care provider figure out what could be causing your insomnia. Write down: When you sleep. When you wake up during the night. How well you sleep. How rested you feel the next day. Any side effects of medicines you are taking. What you eat and drink. Make your bedroom a dark, comfortable place where it is easy to fall asleep. Put up shades or blackout curtains to block light from outside. Use a white noise machine to block noise. Keep the temperature cool. Limit screen  use before bedtime. This includes: Watching TV. Using your smartphone, tablet, or computer. Stick to a routine that  includes going to bed and waking up at the same times every day and night. This can help you fall asleep faster. Consider making a quiet activity, such as reading, part of your nighttime routine. Try to avoid taking naps during the day so that you sleep better at night. Get out of bed if you are still awake after 15 minutes of trying to sleep. Keep the lights down, but try reading or doing a quiet activity. When you feel sleepy, go back to bed. General instructions Take over-the-counter and prescription medicines only as told by your health care provider. Exercise regularly, as told by your health care provider. Avoid exercise starting several hours before bedtime. Use relaxation techniques to manage stress. Ask your health care provider to suggest some techniques that may work well for you. These may include: Breathing exercises. Routines to release muscle tension. Visualizing peaceful scenes. Make sure that you drive carefully. Avoid driving if you feel very sleepy. Keep all follow-up visits as told by your health care provider. This is important. Contact a health care provider if: You are tired throughout the day. You have trouble in your daily routine due to sleepiness. You continue to have sleep problems, or your sleep problems get worse. Get help right away if: You have serious thoughts about hurting yourself or someone else. If you ever feel like you may hurt yourself or others, or have thoughts about taking your own life, get help right away. You can go to your nearest emergency department or call: Your local emergency services (911 in the [place] A suicide crisis helpline, such as the National Suicide Prevention Lifeline at (470)270-3707 or 988 in the [place] This is open 24 hours a day. Summary Insomnia is a sleep disorder that makes it difficult to fall asleep or stay asleep. Insomnia can be long-term (chronic) or short-term (acute). Treatment for insomnia depends on the cause.  Treatment may focus on treating an underlying condition that is causing insomnia. Keep a sleep diary to help you and your health care provider figure out what could be causing your insomnia. This information is not intended to replace advice given to you by your health care provider. Make sure you discuss any questions you have with your health care provider. Document Revised: 03/13/2021 Document Reviewed: 06/28/2020 Elsevier Patient Education  2022 [employer].

## 2021-08-30 NOTE — Progress Notes (Signed)
Acute Office Visit  Subjective:    Patient ID: Harold Forbes, male    DOB: 05/24/1971, 50 y.o.   MRN: [id number]  Chief Complaint  Patient presents with   Insomnia    Ins will not pay for dayvigo    HPI Patient is in today for insomnia. Currently on Trazodone 50 mg, which does help with falling asleep but will still wake up after several hours with anxious thoughts. Taking Buspar 5 mg for anxiety as needed, sees a psychologist who helps with sleep hygiene and coping mechanisms. He has tried going up on Trazodone to 100 mg but this makes him feel very awake and jittery. He has also tried taking another 25 mg of Trazodone in the middle of the night, which does help but will make feel tired and drowsy in the morning.   INSOMNIA Duration: 2-3 years Satisfied with sleep quality: no Difficulty falling asleep: no, Trazodone 50 mg at night is helping falling asleep  Difficulty staying asleep: yes Waking a few hours after sleep onset: yes Early morning awakenings: yes Daytime hypersomnolence: yes Wakes feeling refreshed: no Good sleep hygiene: yes Apnea: yes Snoring: yes Depressed/anxious mood: yes Restless legs/nocturnal leg cramps: no Chronic pain/arthritis: no History of sleep study: no - had one ordered several years ago but never had done Treatments attempted: melatonin, Trazodone    Past Medical History:  Diagnosis Date   ADD (attention deficit disorder)    Asthma    DDD (degenerative disc disease), lumbar    GERD (gastroesophageal reflux disease)     Past Surgical History:  Procedure Laterality Date   COLONOSCOPY WITH PROPOFOL N/A 06/27/2019   Procedure: COLONOSCOPY WITH PROPOFOL;  Surgeon: Lin Landsman, MD;  Location: [facility];  Service: Gastroenterology;  Laterality: N/A;   LASIK Bilateral 2000   TONSILLECTOMY AND ADENOIDECTOMY      Family History  Problem Relation Age of Onset   Healthy Mother    Lung cancer Father    Cancer Father 53        lung   Prostate cancer Maternal Uncle     Social History   Socioeconomic History   Marital status: Single    Spouse name: Not on file   Number of children: 0   Years of education: 12   Highest education level: Master's degree (e.g., MA, MS, MEng, MEd, MSW, MBA)  Occupational History   Not on file  Tobacco Use   Smoking status: Never   Smokeless tobacco: Never  Vaping Use   Vaping Use: Never used  Substance and Sexual Activity   Alcohol use: Yes    Alcohol/week: 0.0 standard drinks    Comment: occasional   Drug use: No   Sexual activity: Yes    Comment: multiple partners using condoms, male partners  Other Topics Concern   Not on file  Social History Narrative   Not on file   Social Determinants of Health   Financial Resource Strain: Low Risk    Difficulty of Paying Living Expenses: Not hard at all  Food Insecurity: No Food Insecurity   Worried About Charity fundraiser in the Last Year: Never true   Arboriculturist in the Last Year: Never true  Transportation Needs: No Transportation Needs   Lack of Transportation (Medical): No   Lack of Transportation (Non-Medical): No  Physical Activity: Sufficiently Active   Days of Exercise per Week: 5 days   Minutes of Exercise per Session: 60 min  Stress: Stress  Concern Present   Feeling of Stress : To some extent  Social Connections: Unknown   Frequency of Communication with Friends and Family: Twice a week   Frequency of Social Gatherings with Friends and Family: Once a week   Attends Religious Services: Not on Electrical engineer or Organizations: No   Attends Archivist Meetings: Never   Marital Status: Never married  Human resources officer Violence: Not At Risk   Fear of Current or Ex-Partner: No   Emotionally Abused: No   Physically Abused: No   Sexually Abused: No    Outpatient Medications Prior to Visit  Medication Sig Dispense Refill   busPIRone (BUSPAR) 5 MG tablet Take 1-3 tablets (5-15 mg  total) by mouth 2 (two) times daily as needed. 120 tablet 0   cyclobenzaprine (FLEXERIL) 10 MG tablet Take 10 mg by mouth 3 (three) times daily.     Lemborexant (DAYVIGO) 10 MG TABS Take 10 mg by mouth at bedtime as needed. 30 tablet 5   naproxen (NAPROSYN) 375 MG tablet Take 375 mg by mouth 2 (two) times daily.     omeprazole (PRILOSEC) 20 MG capsule Take 1 capsule (20 mg total) by mouth daily. 90 capsule 1   pantoprazole (PROTONIX) 40 MG tablet TAKE ONE TABLET BY MOUTH EVERY DAY TO CONTROL STOMACH ACID     PROAIR HFA 108 (90 Base) MCG/ACT inhaler      traZODone (DESYREL) 50 MG tablet Take 1 tablet (50 mg total) by mouth at bedtime as needed for sleep. 90 tablet 1   No facility-administered medications prior to visit.    No Known Allergies  Review of Systems  Constitutional:  Positive for fatigue. Negative for chills and fever.  Psychiatric/Behavioral:  Positive for sleep disturbance. The patient is nervous/anxious.       Objective:    Physical Exam Constitutional:      Appearance: Normal appearance.  HENT:     Head: Normocephalic and atraumatic.  Eyes:     Conjunctiva/sclera: Conjunctivae normal.  Cardiovascular:     Rate and Rhythm: Normal rate and regular rhythm.  Pulmonary:     Effort: Pulmonary effort is normal.     Breath sounds: Normal breath sounds.  Musculoskeletal:     Right lower leg: No edema.     Left lower leg: No edema.  Skin:    General: Skin is warm and dry.  Neurological:     General: No focal deficit present.     Mental Status: He is alert. Mental status is at baseline.  Psychiatric:        Mood and Affect: Mood normal.        Behavior: Behavior normal.    BP 122/84    Pulse 79    Temp 97.9 F (36.6 C)    Resp 16    Ht 5' 8" (1.727 m)    Wt 195 lb 4.8 oz (88.6 kg)    SpO2 99%    BMI 29.70 kg/m  Wt Readings from Last 3 Encounters:  05/31/21 193 lb 8 oz (87.8 kg)  05/30/20 193 lb 12.8 oz (87.9 kg)  02/07/20 190 lb (86.2 kg)    Health  Maintenance Due  Topic Date Due   Pneumococcal Vaccine 55-56 Years old (1 - PCV) Never done    There are no preventive care reminders to display for this patient.   Lab Results  Component Value Date   TSH 0.72 05/31/2021   Lab Results  Component  Value Date   WBC 10.0 05/31/2021   HGB 13.3 05/31/2021   HCT 40.2 05/31/2021   MCV 86.5 05/31/2021   PLT 325 05/31/2021   Lab Results  Component Value Date   NA 138 05/31/2021   K 4.7 05/31/2021   CO2 25 05/31/2021   GLUCOSE 94 05/31/2021   BUN 17 05/31/2021   CREATININE 1.21 05/31/2021   BILITOT 1.3 (H) 05/31/2021   ALKPHOS 51 11/19/2016   AST 17 05/31/2021   ALT 16 05/31/2021   PROT 6.9 05/31/2021   ALBUMIN 4.3 11/19/2016   CALCIUM 9.3 05/31/2021   EGFR 72 05/31/2021   Lab Results  Component Value Date   CHOL 139 05/31/2021   Lab Results  Component Value Date   HDL 42 05/31/2021   Lab Results  Component Value Date   LDLCALC 83 05/31/2021   Lab Results  Component Value Date   TRIG 63 05/31/2021   Lab Results  Component Value Date   CHOLHDL 3.3 05/31/2021   Lab Results  Component Value Date   HGBA1C 5.5 05/31/2021       Assessment & Plan:   1. Insomnia, unspecified type/Snoring: Sleep study ordered to rule out OSA. In the meantime, continue Trazodone 50 mg at night as well as extended release Melatonin to help him stay asleep throughout the night. Follow up in 3 months.   - Melatonin ER 5 MG TBCR; Take 5 mg by mouth at bedtime.  Dispense: 90 tablet; Refill: 3 - Ambulatory referral to [place], DO

## 2021-08-31 ENCOUNTER — Other Ambulatory Visit: Payer: Self-pay | Admitting: Family Medicine

## 2021-08-31 DIAGNOSIS — G4700 Insomnia, unspecified: Secondary | ICD-10-CM

## 2021-08-31 MED ORDER — MELATONIN ER 5 MG PO TBCR: 5.0000 mg | EXTENDED_RELEASE_TABLET | Freq: Every evening | ORAL | 3 refills | Status: DC

## 2021-09-03 MED ORDER — MELATONIN ER 5 MG PO TBCR: 5.0000 mg | EXTENDED_RELEASE_TABLET | Freq: Every evening | ORAL | 3 refills | Status: DC

## 2021-09-03 NOTE — Addendum Note (Signed)
Addended by: [name] on: 09/03/2021 07:56 AM   Modules accepted: Orders

## 2021-09-20 ENCOUNTER — Telehealth: Payer: Self-pay

## 2021-09-20 NOTE — Telephone Encounter (Signed)
Copied from CRM [id number]. Topic: Referral - Question >> Sep 20, 2021 11:51 AM Daphine Deutscher D wrote: Reason for CRM: Pt called asking about a referral for a sleep study.  I do not see one in place.  CB#  [phone]

## 2021-09-30 ENCOUNTER — Telehealth: Payer: Self-pay

## 2021-09-30 NOTE — Telephone Encounter (Signed)
Called sleep med they state have never received an order?  Please re send thanks

## 2021-09-30 NOTE — Telephone Encounter (Signed)
Copied from [place] [id number]. Topic: General - Inquiry >> Sep 30, 2021  1:02 PM Loma Boston wrote: Reason for CRM: Pt is reaching out as has had the sleep study study referral for a month, has tried repeated to get sch. It is in the "ready to sch" stage and pt has repeatedly call and can never get thru to set up appointment, extremely gracious but wanting to know if Dr. Rosana Berger nurse can step in and get them to sch fu at [phone].

## 2021-10-15 ENCOUNTER — Telehealth: Payer: Self-pay

## 2021-10-15 NOTE — Telephone Encounter (Signed)
Copied from CRM [id number]. Topic: Referral - Status >> Oct 15, 2021  2:04 PM Marylen Ponto wrote: Reason for CRM: Pt stated he has not heard back from anyone regarding the referral for sleep study. Pt is very upset that he has not been contacted. Pt requests call back asap with update on sleep study appt. Cb# [phone]    Information has been [facility] to the [facility]  on tody, 10/15/2021 and Stacie will be contacting patient to schedule.

## 2021-11-18 NOTE — Telephone Encounter (Signed)
Pt has called in very upset as no support for getting sleep study and has  contacted office, on hold and pt has hung up, office has investigated and states that [employer],Stacy, is probably who that has been trying to reach him. Please have pt reaching out at this number if pt returns call. [phone] ?

## 2021-11-18 NOTE — Telephone Encounter (Signed)
Pt called saying he has not heard anything from the office or the sleep study place and that was since 10/15/21.  He wants to know what is the status of the sleep study ? ?CB#  [phone] ?

## 2021-11-28 ENCOUNTER — Encounter: Payer: Self-pay | Admitting: Internal Medicine

## 2021-11-28 ENCOUNTER — Telehealth (INDEPENDENT_AMBULATORY_CARE_PROVIDER_SITE_OTHER): Admitting: Family Medicine

## 2021-11-28 DIAGNOSIS — R0683 Snoring: Secondary | ICD-10-CM

## 2021-11-28 DIAGNOSIS — G4700 Insomnia, unspecified: Secondary | ICD-10-CM | POA: Diagnosis not present

## 2021-11-28 DIAGNOSIS — R351 Nocturia: Secondary | ICD-10-CM

## 2021-11-28 DIAGNOSIS — R29818 Other symptoms and signs involving the nervous system: Secondary | ICD-10-CM

## 2021-11-28 DIAGNOSIS — K219 Gastro-esophageal reflux disease without esophagitis: Secondary | ICD-10-CM

## 2021-11-28 MED ORDER — DAYVIGO 10 MG PO TABS: 10.0000 mg | ORAL_TABLET | Freq: Every evening | ORAL | 5 refills | Status: DC | PRN

## 2021-11-28 NOTE — Progress Notes (Signed)
?   11/28/21 0949  ?Epworth Sleepiness Scale  ?Sitting and reading 0  ?Watching TV 0  ?Sitting, inactive in a public place (e.g. a theatre or a meeting) 0  ?As a passenger in a car for an hour without a break 0  ?Lying down to rest in the afternoon when circumstances permit 2  ?Sitting and talking to someone 0  ?Sitting quietly after a lunch without alcohol 1  ?In a car, while stopped for a few minutes in traffic 1  ?Total score 4  ? ? ?

## 2021-11-28 NOTE — Progress Notes (Signed)
? ?Name: Harold Forbes   MRN: [id number]    DOB: 1971/02/24   Date:11/28/2021 ? ?     Progress Note ? ?Subjective:  ? ? ?Chief Complaint ? ?Chief Complaint  ?Patient presents with  ? Follow-up  ? Medication Refill  ? Insomnia  ?  Wants refill on dayvigo ins. Want pay but he is going to try.  So he also needs new referral placed for at home sleep study.  ? ? ?I connected with  Orrin Brigham  on 11/28/21 at  9:40 AM EDT by a video enabled telemedicine application and verified that I am speaking with the correct person using two identifiers.  I discussed the limitations of evaluation and management by telemedicine and the availability of in person appointments. The patient expressed understanding and agreed to proceed. Staff also discussed with the patient that there may be a patient responsible charge related to this service. ?Patient Location: home ?Provider Location: home office ?Additional Individuals present: none ? ?Presents for f/up on insomnia: ?Currently on Trazodone 50 mg, which does help with falling asleep but will still wake up after several hours with anxious thoughts. Taking Buspar 5 mg for anxiety as needed, sees a psychologist who helps with sleep hygiene and coping mechanisms. He has tried going up on Trazodone to 100 mg but this makes him feel very awake and jittery. He has also tried taking another 25 mg of Trazodone in the middle of the night, which does help but will make feel tired and drowsy in the morning.  ?  ?  ?INSOMNIA ?Duration: 2-3 years ?Satisfied with sleep quality: no ?Difficulty falling asleep: no, Trazodone 50 mg at night is helping falling asleep  ?Difficulty staying asleep: yes ?Waking a few hours after sleep onset: yes ?Early morning awakenings: yes ?Daytime hypersomnolence: yes ?Wakes feeling refreshed: no ?Good sleep hygiene: yes ?Apnea: yes ?Snoring: yes ?Depressed/anxious mood: yes ?Restless legs/nocturnal leg cramps: no ?Chronic pain/arthritis: no ?History of sleep study:  no - had one ordered several years ago but never had done ?Treatments attempted: melatonin, Trazodone  ?  ?  ?Currently on Trazodone 50 and ER Melatonin,unable to complete sleep study in the past ?Dayvigo Rx previously  ?Still using trazodone but wants to get dayvigo  ? ? ? 11/28/21 0949  ?Epworth Sleepiness Scale  ?Sitting and reading 0  ?Watching TV 0  ?Sitting, inactive in a public place (e.g. a theatre or a meeting) 0  ?As a passenger in a car for an hour without a break 0  ?Lying down to rest in the afternoon when circumstances permit 2  ?Sitting and talking to someone 0  ?Sitting quietly after a lunch without alcohol 1  ?In a car, while stopped for a few minutes in traffic 1  ?Total score 4  ? ?Sleep on back snore exceptionally bad, will wake himself up, sleeping on side mostly  ? 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 ?GERD - prilosec daily and avoids food triggers, still have episodes waking him from sleep, cannot go without PPI ? ?Also has some nocturia with certain foods/triggers ? ?Patient Active Problem List  ? Diagnosis Date Noted  ? Pure hypercholesterolemia 05/29/2020  ? Anxiety disorder 02/07/2020  ? Acute stress reaction 02/07/2020  ? Insomnia 02/07/2020  ? 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 Encounter for screening colonoscopy   ? Preventative health care 01/29/2018  ? Gastroesophageal reflux disease 01/29/2018  ? Screening for STDs (sexually  transmitted diseases) 11/19/2016  ? Right elbow tendonitis 11/19/2016  ? Annual physical exam 11/19/2015  ? Frequent urination 11/19/2015  ? ? ?Social History  ? ?Tobacco Use  ? Smoking status: Never  ? Smokeless tobacco: Never  ?Substance Use Topics  ? Alcohol use: Yes  ?  Alcohol/week: 0.0 standard drinks  ?  Comment: occasional  ? ? ? ?Current Outpatient Medications:  ?  busPIRone (BUSPAR) 5 MG tablet, Take 1-3 tablets (5-15 mg total) by mouth 2 (two) times daily as needed., Disp: 120 tablet, Rfl: 0 ?   Melatonin ER 5 MG TBCR, Take 5 mg by mouth at bedtime., Disp: 90 tablet, Rfl: 3 ?  naproxen (NAPROSYN) 375 MG tablet, Take 375 mg by mouth 2 (two) times daily., Disp: , Rfl:  ?  omeprazole (PRILOSEC) 20 MG capsule, Take 1 capsule (20 mg total) by mouth daily., Disp: 90 capsule, Rfl: 1 ?  PROAIR HFA 108 (90 Base) MCG/ACT inhaler, , Disp: , Rfl:  ?  traZODone (DESYREL) 50 MG tablet, Take 1 tablet (50 mg total) by mouth at bedtime as needed for sleep., Disp: 90 tablet, Rfl: 1 ?  Lemborexant (DAYVIGO) 10 MG TABS, Take 10 mg by mouth at bedtime as needed. (Patient not taking: Reported on 11/28/2021), Disp: 30 tablet, Rfl: 5 ? ?No Known Allergies ? ?I personally reviewed active problem list, medication list, allergies, family history, social history, health maintenance, notes from last encounter, lab results, imaging with the patient/caregiver today. ? ? ?Review of Systems  ?Constitutional: Negative.   ?HENT: Negative.    ?Eyes: Negative.   ?Respiratory: Negative.    ?Cardiovascular: Negative.   ?Gastrointestinal: Negative.   ?Endocrine: Negative.   ?Genitourinary: Negative.   ?Musculoskeletal: Negative.   ?Skin: Negative.   ?Allergic/Immunologic: Negative.   ?Neurological: Negative.   ?Hematological: Negative.   ?Psychiatric/Behavioral:  The patient has insomnia.   ?All other systems reviewed and are negative.  ? ? ?Objective:  ? ?Virtual encounter, vitals limited, only able to obtain the following ?There were no vitals filed for this visit. ?There is no height or weight on file to calculate BMI. ?Nursing Note and Vital Signs reviewed. ? ?Physical Exam ?Vitals and nursing note reviewed.  ?Constitutional:   ?   General: He is not in acute distress. ?   Appearance: Normal appearance. He is not ill-appearing, toxic-appearing or diaphoretic.  ?Pulmonary:  ?   Effort: No respiratory distress.  ?Neurological:  ?   Mental Status: He is alert.  ? ? ?PE limited by virtual encounter ? ?No results found for this or any previous  visit (from the past 72 hour(s)). ? ?Assessment and Plan:  ? ?  ICD-10-CM   ?1. Insomnia, unspecified type  G47.00 Lemborexant (DAYVIGO) 10 MG TABS  ? Rx for dayvigo- pt still trying to get med, working with psych, trazodone in the meantime  ?  ?2. Snoring  R06.83 Ambulatory referral to Pulmonology  ? when on back, wakes himself from sleep - would like home sleep study, ESS score of 4  ?  ?3. Insomnia, unspecified type  G47.00 Lemborexant (DAYVIGO) 10 MG TABS  ?  ?4. Suspected sleep apnea  R29.818 Ambulatory referral to Pulmonology  ? first tried to work up 4 years ago  ?  ?5. Nocturia  R35.1   ? a lot of triggers, sweeteners, cold drinks after a certain time of day - [facility]   ?  ?6. Gastroesophageal reflux disease, unspecified whether esophagitis present  K21.9   ?  long term PPI with flares, [facility] f[facility]   ?  ?  ?- I discussed the assessment and treatment plan with the patient. The patient was provided an opportunity to ask questions and all were answered. The patient agreed with the plan and demonstrated an understanding of the instructions. ? ?I provided 21+ minutes of non-face-to-face time during this encounter. ? ?Danelle BerryLeisa Sora Olivo, PA-C ?11/28/21 9:41 AM ? ? ? ? ?

## 2021-11-28 NOTE — Progress Notes (Deleted)
Virtual Visit via Video Note ? ?I connected with Harold Forbes on 11/28/21 at  9:40 AM EDT by a video enabled telemedicine application and verified that I am speaking with the correct person using two identifiers. ? ?Location: ?Patient: Home ?Provider: [facility] ?  ?I discussed the limitations of evaluation and management by telemedicine and the availability of in person appointments. The patient expressed understanding and agreed to proceed. ? ?History of Present Illness: ? ?Harold Forbes is a 51 year old male presenting via telemedicine for follow up.  ? ?Currently on Trazodone 50 mg, which does help with falling asleep but will still wake up after several hours with anxious thoughts. Taking Buspar 5 mg for anxiety as needed, sees a psychologist who helps with sleep hygiene and coping mechanisms. He has tried going up on Trazodone to 100 mg but this makes him feel very awake and jittery. He has also tried taking another 25 mg of Trazodone in the middle of the night, which does help but will make feel tired and drowsy in the morning.  ?  ? ?INSOMNIA ?Duration: 2-3 years ?Satisfied with sleep quality: no ?Difficulty falling asleep: no, Trazodone 50 mg at night is helping falling asleep  ?Difficulty staying asleep: yes ?Waking a few hours after sleep onset: yes ?Early morning awakenings: yes ?Daytime hypersomnolence: yes ?Wakes feeling refreshed: no ?Good sleep hygiene: yes ?Apnea: yes ?Snoring: yes ?Depressed/anxious mood: yes ?Restless legs/nocturnal leg cramps: no ?Chronic pain/arthritis: no ?History of sleep study: no - had one ordered several years ago but never had done ?Treatments attempted: melatonin, Trazodone  ?  ? ?Currently on Trazodone 50 and ER Melatonin, refused sleep study ? ? ?Observations/Objective: ? ? ?Assessment and Plan: ? ? ?Follow Up Instructions: ? ?  ?I discussed the assessment and treatment plan with the patient. The patient was provided an opportunity to ask questions and all were answered.  The patient agreed with the plan and demonstrated an understanding of the instructions. ?  ?The patient was advised to call back or seek an in-person evaluation if the symptoms worsen or if the condition fails to improve as anticipated. ? ?I provided *** minutes of non-face-to-face time during this encounter. ? ? ?[name], DO ? ?

## 2021-12-08 ENCOUNTER — Encounter: Payer: Self-pay | Admitting: Family Medicine

## 2021-12-31 ENCOUNTER — Telehealth: Payer: Self-pay

## 2021-12-31 ENCOUNTER — Encounter: Payer: Self-pay | Admitting: Internal Medicine

## 2021-12-31 ENCOUNTER — Ambulatory Visit: Admitting: Internal Medicine

## 2021-12-31 VITALS — BP 127/60 | HR 58 | Temp 97.8°F | Resp 16 | Ht 69.0 in | Wt 199.2 lb

## 2021-12-31 DIAGNOSIS — G4719 Other hypersomnia: Secondary | ICD-10-CM | POA: Diagnosis not present

## 2021-12-31 DIAGNOSIS — G4700 Insomnia, unspecified: Secondary | ICD-10-CM | POA: Diagnosis not present

## 2021-12-31 NOTE — Patient Instructions (Signed)

## 2021-12-31 NOTE — Progress Notes (Signed)
[facility] ?[address] ?[place], [place] [address] ? ?Pulmonary Sleep Medicine  ? ?Office Visit Note ? ?Patient Name: Harold Forbes ?DOB: [birth year] ?MRN [id number] ? ?Date of Service: 12/31/2021 ? ?Complaints/HPI: Difficulty sleeping going on for a few years. He states he had been doing well prior to that. 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 He states he goes to bed around 10p wakes around 530am during the work week he wakes around 7am on weekends. He states he has been using trazedone to fall asleep. He also has been using melatonin. He feels it takes him to fall asleep with meds. He feels that trazedone is not working as well as it should. He wakes at 3am. He states his brain is racing and he has difficulty. He states he drinks in the evening 7pm. Usually beer. He states he has one cup of coffee in the morning. He does snore and is worse in the back. States he does occasionally have a morning headache. He has never fallen asleep driving. He states that he has not fallen asleep at a red light. He states his work has not suffered. When he was younger he slept 8 hours typically ? ?ROS ? ?General: (-) fever, (-) chills, (-) night sweats, (-) weakness ?Skin: (-) rashes, (-) itching,. ?Eyes: (-) visual changes, (-) redness, (-) itching. ?Nose and Sinuses: (-) nasal stuffiness or itchiness, (-) postnasal drip, (-) nosebleeds, (-) sinus trouble. ?Mouth and Throat: (-) sore throat, (-) hoarseness. ?Neck: (-) swollen glands, (-) enlarged thyroid, (-) neck pain. ?Respiratory: - cough, (-) bloody sputum, - shortness of breath, - wheezing. ?Cardiovascular: - ankle swelling, (-) chest pain. ?Lymphatic: (-) lymph node enlargement. ?Neurologic: (-) numbness, (-) tingling. ?Psychiatric: (-) anxiety, (-) depression ? ? ?Current Medication: ?Outpatient Encounter Medications as of 12/31/2021  ?Medication Sig  ? busPIRone (BUSPAR) 5 MG tablet Take 1-3 tablets (5-15 mg total) by  mouth 2 (two) times daily as needed.  ? Melatonin ER 5 MG TBCR Take 5 mg by mouth at bedtime.  ? naproxen (NAPROSYN) 375 MG tablet Take 375 mg by mouth 2 (two) times daily.  ? omeprazole (PRILOSEC) 20 MG capsule Take 1 capsule (20 mg total) by mouth daily.  ? PROAIR HFA 108 (90 Base) MCG/ACT inhaler   ? traZODone (DESYREL) 50 MG tablet Take 1 tablet (50 mg total) by mouth at bedtime as needed for sleep.  ? Lemborexant (DAYVIGO) 10 MG TABS Take 10 mg by mouth at bedtime as needed. (Patient not taking: Reported on 12/31/2021)  ? ?No facility-administered encounter medications on file as of 12/31/2021.  ? ? ?Surgical History: ?Past Surgical History:  ?Procedure Laterality Date  ? COLONOSCOPY WITH PROPOFOL N/A 06/27/2019  ? Procedure: COLONOSCOPY WITH PROPOFOL;  Surgeon: Toney Reil, MD;  Location: [facility] ENDOSCOPY;  Service: Gastroenterology;  Laterality: N/A;  ? LASIK Bilateral 2000  ? TONSILLECTOMY AND ADENOIDECTOMY    ? ? ?Medical History: ?Past Medical History:  ?Diagnosis Date  ? ADD (attention deficit disorder)   ? Asthma   ? DDD (degenerative disc disease), lumbar   ? GERD (gastroesophageal reflux disease)   ? ? ?Family History: ?Family History  ?Problem Relation Age of Onset  ? Healthy Mother   ? Lung cancer Father   ? Cancer Father 44  ?     lung  ? Prostate cancer Maternal Uncle   ? ? ?Social History: ?Social History  ? ?Socioeconomic History  ? Marital status:  Single  ?  Spouse name: Not on file  ? Number of children: 0  ? Years of education: 78  ? Highest education level: Master's degree (e.g., MA, MS, MEng, MEd, MSW, MBA)  ?Occupational History  ? Not on file  ?Tobacco Use  ? Smoking status: Never  ? Smokeless tobacco: Never  ?Vaping Use  ? Vaping Use: Never used  ?Substance and Sexual Activity  ? Alcohol use: Yes  ?  Alcohol/week: 0.0 standard drinks  ?  Comment: occasional  ? Drug use: No  ? Sexual activity: Yes  ?  Comment: multiple partners using condoms, male partners  ?Other Topics Concern  ? Not  on file  ?Social History Narrative  ? Not on file  ? ?Social Determinants of Health  ? ?Financial Resource Strain: Low Risk   ? Difficulty of Paying Living Expenses: Not hard at all  ?Food Insecurity: No Food Insecurity  ? Worried About Programme researcher, broadcasting/film/video in the Last Year: Never true  ? Ran Out of Food in the Last Year: Never true  ?Transportation Needs: No Transportation Needs  ? Lack of Transportation (Medical): No  ? Lack of Transportation (Non-Medical): No  ?Physical Activity: Sufficiently Active  ? Days of Exercise per Week: 5 days  ? Minutes of Exercise per Session: 60 min  ?Stress: Stress Concern Present  ? Feeling of Stress : To some extent  ?Social Connections: Unknown  ? Frequency of Communication with Friends and Family: Twice a week  ? Frequency of Social Gatherings with Friends and Family: Once a week  ? Attends Religious Services: Not on file  ? Active Member of Clubs or Organizations: No  ? Attends Banker Meetings: Never  ? Marital Status: Never married  ?Intimate Partner Violence: Not At Risk  ? Fear of Current or Ex-Partner: No  ? Emotionally Abused: No  ? Physically Abused: No  ? Sexually Abused: No  ? ? ?Vital Signs: ?Blood pressure 127/60, pulse (!) 58, temperature 97.8 ?F (36.6 ?C), resp. rate 16, height 5\' 9"  (1.753 m), weight 199 lb 3.2 oz (90.4 kg), SpO2 97 %. ? ?Examination: ?General Appearance: The patient is well-developed, well-nourished, and in no distress. ?Skin: Gross inspection of skin unremarkable. ?Head: normocephalic, no gross deformities. ?Eyes: no gross deformities noted. ?ENT: ears appear grossly normal no exudates. ?Neck: Supple. No thyromegaly. No LAD. ?Respiratory: no rhonchi noted. ?Cardiovascular: Normal S1 and S2 without murmur or rub. ?Extremities: No cyanosis. pulses are equal. ?Neurologic: Alert and oriented. No involuntary movements. ? ?LABS: ?No results found for this or any previous visit (from the past 2160 hour(s)). ? ?Radiology: ?No results  found. ? ?No results found. ? ?No results found. ? ? ? ?Assessment and Plan: ?Patient Active Problem List  ? Diagnosis Date Noted  ? Pure hypercholesterolemia 05/29/2020  ? Anxiety disorder 02/07/2020  ? Acute stress reaction 02/07/2020  ? Insomnia 02/07/2020  ? 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 Encounter for screening colonoscopy   ? Preventative health care 01/29/2018  ? Gastroesophageal reflux disease 01/29/2018  ? Screening for STDs (sexually transmitted diseases) 11/19/2016  ? Right elbow tendonitis 11/19/2016  ? Annual physical exam 11/19/2015  ? Frequent urination 11/19/2015  ? ? ?1. Other hypersomnia ?Patient has significant symptoms of sleep disturbance I think a possibility of obstructive sleep apnea is not ruled out because of the excessive fatigue would be reasonable to get a sleep study done I will go ahead and order this to  be done now.  The perplexing part is that there is also an insomnia component which may very well be psychophysiological but first need to rule out obstructive sleep apnea ?- PSG Sleep Study; Future ? ?2. Insomnia, unspecified type ?Patient does have some issues with insomnia will also need to rule out obstructive sleep apnea as discussed we will get the PSG done as a starting point also discussed sleep hygiene with him extensively ? ?General Counseling: I have discussed the findings of the evaluation and examination with Loistine ChancePhilip.  I have also discussed any further diagnostic evaluation thatmay be needed or ordered today. Leroy verbalizes understanding of the findings of todays visit. We also reviewed his medications today and discussed drug interactions and side effects including but not limited excessive drowsiness and altered mental states. We also discussed that there is always a risk not just to him but also people around him. he has been encouraged to call the office with any questions or concerns that should arise related to todays  visit. ? ?No orders of the defined types were placed in this encounter. ?  ? ?Time spent: 6655 ? ?I have personally obtained a history, examined the patient, evaluated laboratory and imaging results, formulated the assessment

## 2021-12-31 NOTE — Telephone Encounter (Signed)
Sleep Study ordered. Printed. Gave to Titania-Toni ?

## 2022-01-28 ENCOUNTER — Ambulatory Visit: Admitting: Internal Medicine

## 2022-01-30 ENCOUNTER — Telehealth: Payer: Self-pay

## 2022-01-30 NOTE — Telephone Encounter (Signed)
Patient scheduled for PSG on 02/01/22 @ [facility] 

## 2022-02-15 ENCOUNTER — Encounter (INDEPENDENT_AMBULATORY_CARE_PROVIDER_SITE_OTHER): Admitting: Internal Medicine

## 2022-02-15 ENCOUNTER — Encounter: Payer: Self-pay | Admitting: Internal Medicine

## 2022-02-15 DIAGNOSIS — G4719 Other hypersomnia: Secondary | ICD-10-CM

## 2022-02-21 NOTE — Procedures (Signed)
[facility]  Polysomnogram Report Part I                                                                 Phone: [phone] Fax: [phone]  Patient Name: Harold Forbes, Harold Forbes. Acquisition Number: [id number]  Date of Birth: 21-Mar-1971 Acquisition Date: 02/15/2022  Referring Physician: Yevonne Pax MD     History: The patient is a 51 year old male who was referred for evaluation of possible sleep apnea with snoring and sleepiness. Medical History: Anxiety, GERD, ADHD, Asthma, degenerative disc disease.  Medications: buspirone, melatonin, naproxen, omeprazole, Proair HFA, trazodone, lemborexan.t  Procedure: This routine overnight polysomnogram was performed on the Alice 5 using the standard diagnostic protocol. This included 6 channels of EEG, 2 channels of EOG, chin EMG, bilateral anterior tibialis EMG, nasal/oral thermistor, PTAF (nasal pressure transducer), chest and abdominal wall movements, EKG, and pulse oximetry.  Description: The total recording time was 435.0 minutes. The total sleep time was 365.5 minutes. There were a total of 34.0 minutes of wakefulness after sleep onset for a reducedsleep efficiency of 84.0%. The latency to sleep onset was slightly prolonged at 35.5 minutes. The R sleep onset latency was short at 41.0 minutes. Sleep parameters, as a percentage of the total sleep time, demonstrated 3.4% of sleep was in N1 sleep, 80.6% N2, 0.0% N3 and 16.0% R sleep. There were a total of 25 arousals for an arousal index of 4.1 arousals per hour of sleep that was normal.  Respiratory monitoring demonstrated rare no significant snoring in all positions. Only 2 respiratory events were observed during the study. The baseline oxygen saturation during wakefulness was 96%, during NREM sleep averaged 97%, and during REM sleep averaged  97%. The total duration of oxygen < 90% was 0.0 minutes.  Cardiac monitoring- There were no significant cardiac rhythm irregularities.    Periodic limb movement monitoring- did not demonstrate periodic limb movements.   Impression: This routine overnight polysomnogram did not demonstrate significant obstructive sleep apnea with only 2 respiratory events observed.  Sleep latency was only slightly prolonged. The patient was uncomfortable trying to fall asleep on his back. Once he moved to the lateral position, he was able to initiate sleep. The sleep efficiency was reduced. A reduced REM percentage and no slow wave sleep were observed. The patient reports difficulty initiating sleep at home and is currently taking sleep aids.  Sleep hygiene recommendations should be reviewed. The patient may benefit from cognitive behavioral therapy for insomnia. This can be done with a mental health professional or via web-based programs.  Recommendations:     Would recommend weight loss in a patient with a BMI of 28.9.    Yevonne Pax, MD, [facility] Diplomate ABMS-Pulmonary, Critical Care and Sleep Medicine  Electronically reviewed and digitally signed   [facility] Polysomnogram Report Part II  Phone: [phone] Fax: [phone]  Patient last name Harold Forbes Neck Size 17.0   in. Acquisition [id number]  Patient first name Harold Forbes. Weight 190.0 lbs. Started 02/15/2022 at 9:52:53 PM  Birth date 02/15/1984 Height 68.0 in. Stopped 02/16/2022 at 5:20:17 AM  Age 62 BMI 28.9 lb/in2 Duration 435.0  Study Type Adult      Barbara Cower Coursey RPSGT, Reita Chard  Reviewed  by: Valentino Hue Henke, PhD, ABSM, FAASM Sleep Data: Lights Out: 10:00:53 PM Sleep Onset: 10:36:23 PM  Lights On: 5:15:53 AM Sleep Efficiency: 84.0 %  Total Recording Time: 435.0 min Sleep Latency (from Lights Off) 35.5 min  Total Sleep Time (TST): 365.5 min R Latency (from Sleep Onset): 41.0 min  Sleep Period Time: 399.0 min Total number of awakenings: 6  Wake during sleep: 33.5 min Wake After Sleep Onset (WASO): 34.0 min   Sleep Data:         Arousal Summary: Stage   Latency from lights out (min) Latency from sleep onset (min) Duration (min) % Total Sleep Time  Normal values  N 1 35.5 0.0 12.5 3.4 (5%)  N 2 36.0 0.5 294.5 80.6 (50%)  N 3       0.0 0.0 (20%)  R 76.5 41.0 58.5 16.0 (25%)    Number Index  Spontaneous 24 3.9  Apneas & Hypopneas 0 0.0  RERAs 0 0.0       (Apneas & Hypopneas & RERAs)  (0) (0.0)  Limb Movement 1 0.2  Snore 0 0.0  TOTAL 25 4.1     Respiratory Data:  CA OA MA Apnea Hypopnea* A+ H RERA Total  Number 0 2 0 2 0 2 0 2  Mean Dur (sec) 0.0 12.0 0.0 12.0 0.0 12.0 0.0 12.0  Max Dur (sec) 0.0 14.0 0.0 14.0 0.0 14.0 0.0 14.0  Total Dur (min) 0.0 0.4 0.0 0.4 0.0 0.4 0.0 0.4  % of TST 0.0 0.1 0.0 0.1 0.0 0.1 0.0 0.1  Index (#/h TST) 0.0 0.3 0.0 0.3 0.0 0.3 0.0 0.3  *Hypopneas scored based on 4% or greater desaturation.  Sleep Stage:        REM NREM TST  AHI 2.1 0.0 0.3  RDI 2.1 0.0 0.3           Body Position Data:  Sleep (min) TST (%) REM (min) NREM (min) CA (#) OA (#) MA (#) HYP (#) AHI (#/h) RERA (#) RDI (#/h) Desat (#)  Supine    0.00                      0 0.00     Non-Supine 365.50 100.00 58.50 307.00 0.00 2.00 0.00 0.00 0.33 0 0.33 2.00  Right: 365.5 100.00 58.5 307.0 0 2 0 0 0.3 0 0.3 2     Snoring: Total number of snoring episodes  0  Total time with snoring    min (   % of sleep)   Oximetry Distribution:             WK REM NREM TOTAL  Average (%)   96 97 97 97  < 90% 0.0 0.0 0.0 0.0  < 80% 0.0 0.0 0.0 0.0  < 70% 0.0 0.0 0.0 0.0  # of Desaturations* 1 1 1 3   Desat Index (#/hour) 0.9 1.0 0.2 0.5  Desat Max (%) 5 4 3 5   Desat Max Dur (sec) 23.0 22.0 7.0 23.0  Approx Min O2 during sleep 93  Approx min O2 during a respiratory event 93  Was Oxygen added (Y/N) and final rate No:   0 LPM  *Desaturations based on 3% or greater drop from baseline.   Cheyne Stokes Breathing: None Present   Heart Rate Summary:  Average Heart Rate During Sleep 51.9 bpm      Highest Heart Rate During  Sleep (95th %) 58.0 bpm      Highest  Heart Rate During Sleep 155 bpm (artifact)  Highest Heart Rate During Recording (TIB) 155 bpm (artifact)   Heart Rate Observations: Event Type # Events   Bradycardia 0 Lowest HR Scored: N/A  Sinus Tachycardia During Sleep 0 Highest HR Scored: N/A  Narrow Complex Tachycardia 0 Highest HR Scored: N/A  Wide Complex Tachycardia 0 Highest HR Scored: N/A  Asystole 0 Longest Pause: N/A  Atrial Fibrillation 0 Duration Longest Event: N/A  Other Arrythmias  No Type:    Periodic Limb Movement Data: (Primary legs unless otherwise noted) Total # Limb Movement 4 Limb Movement Index 0.7  Total # PLMS    PLMS Index     Total # PLMS Arousals    PLMS Arousal Index     Percentage Sleep Time with PLMS   min (   % sleep)  Mean Duration limb movements (secs)

## 2022-03-07 NOTE — Procedures (Signed)
[facility]  Polysomnogram Report Part I                                                                 Phone: [phone] Fax: [phone]  Patient Name: Harold Forbes, Harold Forbes. Acquisition Number: [id number]  Date of Birth: Oct 28, 1970 Acquisition Date: 02/15/2022  Referring Physician: Yevonne Pax MD     History: The patient is a 51 year old male who was referred for evaluation of possible sleep apnea with snoring and sleepiness. Medical History: Anxiety, GERD, ADHD, Asthma, degenerative disc disease.  Medications: buspirone, melatonin, naproxen, omeprazole, Proair HFA, trazodone, lemborexan.t  Procedure: This routine overnight polysomnogram was performed on the [facility] 5 using the standard diagnostic protocol. This included 6 channels of EEG, 2 channels of EOG, chin EMG, bilateral anterior tibialis EMG, nasal/oral thermistor, PTAF (nasal pressure transducer), chest and abdominal wall movements, EKG, and pulse oximetry.  Description: The total recording time was 435.0 minutes. The total sleep time was 365.5 minutes. There were a total of 34.0 minutes of wakefulness after sleep onset for a reducedsleep efficiency of 84.0%. The latency to sleep onset was slightly prolonged at 35.5 minutes. The R sleep onset latency was short at 41.0 minutes. Sleep parameters, as a percentage of the total sleep time, demonstrated 3.4% of sleep was in N1 sleep, 80.6% N2, 0.0% N3 and 16.0% R sleep. There were a total of 25 arousals for an arousal index of 4.1 arousals per hour of sleep that was normal.  Respiratory monitoring demonstrated rare no significant snoring in all positions. Only 2 respiratory events were observed during the study. The baseline oxygen saturation during wakefulness was 96%, during NREM sleep averaged 97%, and during REM sleep averaged  97%. The total duration of oxygen < 90% was 0.0 minutes.  Cardiac monitoring- There were no significant cardiac rhythm irregularities.    Periodic limb movement monitoring- did not demonstrate periodic limb movements.   Impression: This routine overnight polysomnogram did not demonstrate significant obstructive sleep apnea with only 2 respiratory events observed.  Sleep latency was only slightly prolonged. The patient was uncomfortable trying to fall asleep on his back. Once he moved to the lateral position, he was able to initiate sleep. The sleep efficiency was reduced. A reduced REM percentage and no slow wave sleep were observed. The patient reports difficulty initiating sleep at home and is currently taking sleep aids.  Sleep hygiene recommendations should be reviewed. The patient may benefit from cognitive behavioral therapy for insomnia. This can be done with a mental health professional or via web-based programs.  Recommendations:     Would recommend weight loss in a patient with a BMI of 28.9.    Yevonne Pax, MD, [facility] Diplomate ABMS-Pulmonary, Critical Care and Sleep Medicine  Electronically reviewed and digitally signed   [facility] Polysomnogram Report Part II  Phone: [phone] Fax: [phone]  Patient last name Zagami Neck Size 17.0   in. Acquisition [id number]  Patient first name Harold Forbes. Weight 190.0 lbs. Started 02/15/2022 at 9:52:53 PM  Birth date 07-08-1971 Height 68.0 in. Stopped 02/16/2022 at 5:20:17 AM  Age 60 BMI 28.9 lb/in2 Duration 435.0  Study Type Adult      Barbara Cower Coursey RPSGT, Reita Chard  Reviewed  by: Valentino Hue Henke, PhD, ABSM, FAASM Sleep Data: Lights Out: 10:00:53 PM Sleep Onset: 10:36:23 PM  Lights On: 5:15:53 AM Sleep Efficiency: 84.0 %  Total Recording Time: 435.0 min Sleep Latency (from Lights Off) 35.5 min  Total Sleep Time (TST): 365.5 min R Latency (from Sleep Onset): 41.0 min  Sleep Period Time: 399.0 min Total number of awakenings: 6  Wake during sleep: 33.5 min Wake After Sleep Onset (WASO): 34.0 min   Sleep Data:         Arousal Summary: Stage   Latency from lights out (min) Latency from sleep onset (min) Duration (min) % Total Sleep Time  Normal values  N 1 35.5 0.0 12.5 3.4 (5%)  N 2 36.0 0.5 294.5 80.6 (50%)  N 3       0.0 0.0 (20%)  R 76.5 41.0 58.5 16.0 (25%)    Number Index  Spontaneous 24 3.9  Apneas & Hypopneas 0 0.0  RERAs 0 0.0       (Apneas & Hypopneas & RERAs)  (0) (0.0)  Limb Movement 1 0.2  Snore 0 0.0  TOTAL 25 4.1     Respiratory Data:  CA OA MA Apnea Hypopnea* A+ H RERA Total  Number 0 2 0 2 0 2 0 2  Mean Dur (sec) 0.0 12.0 0.0 12.0 0.0 12.0 0.0 12.0  Max Dur (sec) 0.0 14.0 0.0 14.0 0.0 14.0 0.0 14.0  Total Dur (min) 0.0 0.4 0.0 0.4 0.0 0.4 0.0 0.4  % of TST 0.0 0.1 0.0 0.1 0.0 0.1 0.0 0.1  Index (#/h TST) 0.0 0.3 0.0 0.3 0.0 0.3 0.0 0.3  *Hypopneas scored based on 4% or greater desaturation.  Sleep Stage:        REM NREM TST  AHI 2.1 0.0 0.3  RDI 2.1 0.0 0.3           Body Position Data:  Sleep (min) TST (%) REM (min) NREM (min) CA (#) OA (#) MA (#) HYP (#) AHI (#/h) RERA (#) RDI (#/h) Desat (#)  Supine    0.00                      0 0.00     Non-Supine 365.50 100.00 58.50 307.00 0.00 2.00 0.00 0.00 0.33 0 0.33 2.00  Right: 365.5 100.00 58.5 307.0 0 2 0 0 0.3 0 0.3 2     Snoring: Total number of snoring episodes  0  Total time with snoring    min (   % of sleep)   Oximetry Distribution:             WK REM NREM TOTAL  Average (%)   96 97 97 97  < 90% 0.0 0.0 0.0 0.0  < 80% 0.0 0.0 0.0 0.0  < 70% 0.0 0.0 0.0 0.0  # of Desaturations* 1 1 1 3   Desat Index (#/hour) 0.9 1.0 0.2 0.5  Desat Max (%) 5 4 3 5   Desat Max Dur (sec) 23.0 22.0 7.0 23.0  Approx Min O2 during sleep 93  Approx min O2 during a respiratory event 93  Was Oxygen added (Y/N) and final rate No:   0 LPM  *Desaturations based on 3% or greater drop from baseline.   Cheyne Stokes Breathing: None Present   Heart Rate Summary:  Average Heart Rate During Sleep 51.9 bpm      Highest Heart Rate During  Sleep (95th %) 58.0 bpm      Highest  Heart Rate During Sleep 155 bpm (artifact)  Highest Heart Rate During Recording (TIB) 155 bpm (artifact)   Heart Rate Observations: Event Type # Events   Bradycardia 0 Lowest HR Scored: N/A  Sinus Tachycardia During Sleep 0 Highest HR Scored: N/A  Narrow Complex Tachycardia 0 Highest HR Scored: N/A  Wide Complex Tachycardia 0 Highest HR Scored: N/A  Asystole 0 Longest Pause: N/A  Atrial Fibrillation 0 Duration Longest Event: N/A  Other Arrythmias  No Type:    Periodic Limb Movement Data: (Primary legs unless otherwise noted) Total # Limb Movement 4 Limb Movement Index 0.7  Total # PLMS    PLMS Index     Total # PLMS Arousals    PLMS Arousal Index     Percentage Sleep Time with PLMS   min (   % sleep)  Mean Duration limb movements (secs)

## 2022-04-22 ENCOUNTER — Ambulatory Visit: Admitting: Internal Medicine

## 2022-05-13 ENCOUNTER — Ambulatory Visit: Admitting: Internal Medicine

## 2022-05-13 ENCOUNTER — Encounter: Payer: Self-pay | Admitting: Internal Medicine

## 2022-05-13 VITALS — BP 114/74 | HR 72 | Temp 98.4°F | Resp 18 | Ht 68.0 in | Wt 193.5 lb

## 2022-05-13 DIAGNOSIS — J069 Acute upper respiratory infection, unspecified: Secondary | ICD-10-CM | POA: Diagnosis not present

## 2022-05-13 MED ORDER — METHYLPREDNISOLONE 4 MG PO TBPK: ORAL_TABLET | ORAL | 0 refills | Status: DC

## 2022-05-13 MED ORDER — FLUTICASONE PROPIONATE 50 MCG/ACT NA SUSP: 2.0000 | Freq: Every day | NASAL | 6 refills | Status: DC

## 2022-05-13 MED ORDER — BENZONATATE 100 MG PO CAPS: 100.0000 mg | ORAL_CAPSULE | Freq: Two times a day (BID) | ORAL | 0 refills | Status: DC | PRN

## 2022-05-13 NOTE — Patient Instructions (Signed)
It was great seeing you today!  Plan discussed at today's visit: -Steroid pack sent to pharmacy -Use Mucinex in the morning and cough suppressant at night -Use Flonase 2 sprays on each side twice a day    Follow up in: as needed   Take care and let us know if you have any questions or concerns prior to your next visit.  Dr. Caralee Ates

## 2022-05-13 NOTE — Progress Notes (Signed)
Acute Office Visit  Subjective:     Patient ID: Harold Forbes, male    DOB: 04/12/1971, 51 y.o.   MRN: [id number]  Chief Complaint  Patient presents with   URI    Oncet 1.5 weeks X2 negative covid. Symptoms include: sore throat congestion runny nose and cough    HPI Patient is in today for cough, congestion going on for about 10 days. Negative home COVID test yesterday.   URI Compliant:  -Worst symptom: cough -Fever: no -Cough: yes, dry some mucus productive in the morning, yellow/green -Shortness of breath: yes -Wheezing: yes -Chest pain: no -Chest tightness: yes -Chest congestion: yes -Nasal congestion: yes -Runny nose: yes -Post nasal drip: no -Sneezing: yes -Sore throat: yes -Sinus pressure: no -Headache: no -Face pain: no -Ear pain: no  -Ear pressure: no   -Vomiting: no  -Context:  slightly better -Recurrent sinusitis: no -Relief with OTC cold/cough medications:  mild   -Treatments attempted: mucinex    Review of Systems  Constitutional:  Negative for chills and fever.  HENT:  Positive for congestion and sore throat. Negative for ear pain and tinnitus.   Respiratory:  Positive for cough, sputum production, shortness of breath and wheezing.   Cardiovascular:  Negative for chest pain.  Neurological:  Negative for dizziness and headaches.        Objective:    BP 114/74   Pulse 72   Temp 98.4 F (36.9 C)   Resp 18   Ht 5\' 8"  (1.727 m)   Wt 193 lb 8 oz (87.8 kg)   SpO2 95%   BMI 29.42 kg/m  BP Readings from Last 3 Encounters:  05/13/22 114/74  12/31/21 127/60  08/30/21 122/84   Wt Readings from Last 3 Encounters:  05/13/22 193 lb 8 oz (87.8 kg)  12/31/21 199 lb 3.2 oz (90.4 kg)  08/30/21 195 lb 4.8 oz (88.6 kg)      Physical Exam Constitutional:      Appearance: Normal appearance.  HENT:     Head: Normocephalic and atraumatic.     Nose: Congestion present.     Mouth/Throat:     Mouth: Mucous membranes are moist.     Comments: PND  present Eyes:     Conjunctiva/sclera: Conjunctivae normal.  Cardiovascular:     Rate and Rhythm: Normal rate and regular rhythm.  Pulmonary:     Effort: Pulmonary effort is normal.     Breath sounds: Normal breath sounds. No wheezing, rhonchi or rales.  Lymphadenopathy:     Cervical: No cervical adenopathy.  Skin:    General: Skin is warm and dry.  Neurological:     General: No focal deficit present.     Mental Status: He is alert. Mental status is at baseline.  Psychiatric:        Mood and Affect: Mood normal.        Behavior: Behavior normal.     No results found for any visits on 05/13/22.      Assessment & Plan:   1. Viral upper respiratory tract infection: Home COVID test negative, wouldn't qualify for antivirals at this point. Symptoms mildly improving. Treat with Medrol Dosepak, nasal steroids and cough suppressant at night. If symptoms fail to improve or worsen by the end of the day an antibiotic will be sent for him.   - methylPREDNISolone (MEDROL DOSEPAK) 4 MG TBPK tablet; Day 1: Take 8 mg (2 tablets) before breakfast, 4 mg (1 tablet) after lunch, 4 mg (1 tablet) after  supper, and 8 mg (2 tablets) at bedtime. Day 2:Take 4 mg (1 tablet) before breakfast, 4 mg (1 tablet) after lunch, 4 mg (1 tablet) after supper, and 8 mg (2 tablets) at bedtime. Day 3: Take 4 mg (1 tablet) before breakfast, 4 mg (1 tablet) after lunch, 4 mg (1 tablet) after supper, and 4 mg (1 tablet) at bedtime. Day 4: Take 4 mg (1 tablet) before breakfast, 4 mg (1 tablet) after lunch, and 4 mg (1 tablet) at bedtime. Day 5: Take 4 mg (1 tablet) before breakfast and 4 mg (1 tablet) at bedtime. Day 6: Take 4 mg (1 tablet) before breakfast.  Dispense: 1 each; Refill: 0 - fluticasone (FLONASE) 50 MCG/ACT nasal spray; Place 2 sprays into both nostrils daily.  Dispense: 16 g; Refill: 6 - benzonatate (TESSALON) 100 MG capsule; Take 1 capsule (100 mg total) by mouth 2 (two) times daily as needed for cough.  Dispense:  20 capsule; Refill: 0   Return if symptoms worsen or fail to improve.  [name], DO

## 2022-06-05 ENCOUNTER — Ambulatory Visit (INDEPENDENT_AMBULATORY_CARE_PROVIDER_SITE_OTHER): Admitting: Internal Medicine

## 2022-06-05 ENCOUNTER — Encounter: Payer: Self-pay | Admitting: Internal Medicine

## 2022-06-05 VITALS — BP 118/74 | HR 75 | Temp 97.6°F | Resp 16 | Ht 68.0 in | Wt 195.8 lb

## 2022-06-05 DIAGNOSIS — Z23 Encounter for immunization: Secondary | ICD-10-CM

## 2022-06-05 DIAGNOSIS — Z0000 Encounter for general adult medical examination without abnormal findings: Secondary | ICD-10-CM | POA: Diagnosis not present

## 2022-06-05 DIAGNOSIS — Z125 Encounter for screening for malignant neoplasm of prostate: Secondary | ICD-10-CM | POA: Diagnosis not present

## 2022-06-05 DIAGNOSIS — R7303 Prediabetes: Secondary | ICD-10-CM

## 2022-06-05 DIAGNOSIS — Z13220 Encounter for screening for lipoid disorders: Secondary | ICD-10-CM

## 2022-06-05 NOTE — Progress Notes (Signed)
Name: Harold Forbes   MRN: [id number]    DOB: 07-21-1971   Date:06/05/2022       Progress Note  Subjective  Chief Complaint  Chief Complaint  Patient presents with   Annual Exam    HPI  Patient presents for annual CPE.   Diet: cooks a lot at home, doesn't eat out a lot - eats chicken, fish and salads. Tries to reduce added sugars  Exercise: regular - rowing and elliptical and swimming and weights  Last Dental Exam: twice yearly cleanings  Last Eye Exam: was seen 2 weeks ago   Depression: phq 9 is negative    06/05/2022    8:00 AM 05/13/2022    3:02 PM 11/28/2021    8:49 AM 08/30/2021    1:51 PM 05/31/2021    8:26 AM  Depression screen PHQ 2/9  Decreased Interest 0 0 0 0 0  Down, Depressed, Hopeless 0 0 0 0 0  PHQ - 2 Score 0 0 0 0 0  Altered sleeping 0 0 0 0 0  Tired, decreased energy 0 0 0 0 0  Change in appetite 0 0 0 0 0  Feeling bad or failure about yourself  0 0 0 0 0  Trouble concentrating 0 0 0 0 0  Moving slowly or fidgety/restless 0 0 0 0 0  Suicidal thoughts 0 0 0 0 0  PHQ-9 Score 0 0 0 0 0  Difficult doing work/chores Not difficult at all Not difficult at all Not difficult at all Not difficult at all Not difficult at all    Hypertension:  BP Readings from Last 3 Encounters:  06/05/22 118/74  05/13/22 114/74  12/31/21 127/60    Obesity: Wt Readings from Last 3 Encounters:  06/05/22 195 lb 12.8 oz (88.8 kg)  05/13/22 193 lb 8 oz (87.8 kg)  12/31/21 199 lb 3.2 oz (90.4 kg)   BMI Readings from Last 3 Encounters:  06/05/22 29.77 kg/m  05/13/22 29.42 kg/m  12/31/21 29.42 kg/m     Lipids:  Lab Results  Component Value Date   CHOL 139 05/31/2021   CHOL 165 06/07/2020   CHOL 181 02/03/2018   Lab Results  Component Value Date   HDL 42 05/31/2021   HDL 34 (L) 06/07/2020   HDL 48 02/03/2018   Lab Results  Component Value Date   LDLCALC 83 05/31/2021   LDLCALC 96 06/07/2020   LDLCALC 114 (H) 02/03/2018   Lab Results  Component Value Date    TRIG 63 05/31/2021   TRIG 230 (H) 06/07/2020   TRIG 89 02/03/2018   Lab Results  Component Value Date   CHOLHDL 3.3 05/31/2021   CHOLHDL 4.9 06/07/2020   CHOLHDL 3.8 02/03/2018   No results found for: "LDLDIRECT" Glucose:  Glucose, Bld  Date Value Ref Range Status  05/31/2021 94 65 - 99 mg/dL Final    Comment:    .            Fasting reference interval .   06/07/2020 109 (H) 65 - 99 mg/dL Final    Comment:    .            Fasting reference interval . For someone without known diabetes, a glucose value between 100 and 125 mg/dL is consistent with prediabetes and should be confirmed with a follow-up test. .   02/03/2018 92 65 - 99 mg/dL Final    Comment:    .  Fasting reference interval .     Flowsheet Row [facility]  AUDIT-C Score 4      Single STD testing and prevention (HIV/chl/gon/syphilis):  no Hep C Screening: 2022 negative Skin cancer: Discussed monitoring for atypical lesions-does have a few freckles on his shoulders he will checked today Colorectal cancer: Colonoscopy 06/27/2019, repeat in 10 years Prostate cancer:  yes Lab Results  Component Value Date   PSA 0.22 05/31/2021   PSA 0.3 05/17/2019   PSA 0.4 02/03/2018    Lung cancer:  Low Dose CT Chest recommended if Age 39-80 years, 30 pack-year currently smoking OR have quit w/in 15years. Patient  not applicable a candidate for screening   AAA: The USPSTF recommends one-time screening with ultrasonography in men ages 7665 to 75 years who have ever smoked. Patient   not applicable, a candidate for screening    Vaccines:   HPV: N/A Tdap: 2019 Shingrix: 2021 Pneumonia: Prevnar 23 in 2021 Flu: Due COVID-19: 5 vaccines  Advanced Care Planning: A voluntary discussion about advance care planning including the explanation and discussion of advance directives.  Discussed health care proxy and Living will, and the patient was unable to  identify a health care proxy today.  Patient does have a living will and power of attorney of health care but this is from a few years ago.   Patient Active Problem List   Diagnosis Date Noted   Pure hypercholesterolemia 05/29/2020   Anxiety disorder 02/07/2020   Acute stress reaction 02/07/2020   Insomnia 02/07/2020   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/07/2020   Encounter for screening colonoscopy    Preventative health care 01/29/2018   Gastroesophageal reflux disease 01/29/2018   Screening for STDs (sexually transmitted diseases) 11/19/2016   Right elbow tendonitis 11/19/2016   Annual physical exam 11/19/2015   Frequent urination 11/19/2015    Past Surgical History:  Procedure Laterality Date   COLONOSCOPY WITH PROPOFOL N/A 06/27/2019   Procedure: COLONOSCOPY WITH PROPOFOL;  Surgeon: Toney ReilVanga, Rohini Reddy, MD;  Location: [facility];  Service: Gastroenterology;  Laterality: N/A;   LASIK Bilateral 2000   TONSILLECTOMY AND ADENOIDECTOMY      Family History  Problem Relation Age of Onset   Healthy Mother    Lung cancer Father    Cancer Father [id number]       lung   Prostate cancer Maternal Uncle     Social History   Socioeconomic History   Marital status: Single    Spouse name: Not on file   Number of children: 0   Years of education: 12   Highest education level: Master's degree (e.g., MA, MS, MEng, MEd, MSW, MBA)  Occupational History   Not on file  Tobacco Use   Smoking status: Never   Smokeless tobacco: Never  Vaping Use   Vaping Use: Never used  Substance and Sexual Activity   Alcohol use: Yes    Alcohol/week: 4.0 standard drinks of alcohol    Types: 4 Standard drinks or equivalent per week    Comment: occasional   Drug use: No   Sexual activity: Yes    Comment: multiple partners using condoms, male partners  Other Topics Concern   Not on file  Social History Narrative   Not on file   Social Determinants of Health   Financial  Resource Strain: Low Risk  (06/05/2022)   Overall Financial Resource Strain (CARDIA)    Difficulty of Paying Living Expenses: Not  hard at all  Food Insecurity: No Food Insecurity (06/05/2022)   Hunger Vital Sign    Worried About Running Out of Food in the Last Year: Never true    Ran Out of Food in the Last Year: Never true  Transportation Needs: No Transportation Needs (06/05/2022)   PRAPARE - Hydrologist (Medical): No    Lack of Transportation (Non-Medical): No  Physical Activity: Sufficiently Active (06/05/2022)   Exercise Vital Sign    Days of Exercise per Week: 6 days    Minutes of Exercise per Session: 60 min  Stress: No Stress Concern Present (06/05/2022)   [place]    Feeling of Stress : Only a little  Social Connections: Moderately Integrated (06/05/2022)   Social Connection and Isolation Panel [NHANES]    Frequency of Communication with Friends and Family: More than three times a week    Frequency of Social Gatherings with Friends and Family: More than three times a week    Attends Religious Services: 1 to 4 times per year    Active Member of [employer] or Organizations: Yes    Attends Archivist Meetings: Never    Marital Status: Never married  Intimate Partner Violence: Not At Risk (06/05/2022)   Humiliation, Afraid, Rape, and Kick questionnaire    Fear of Current or Ex-Partner: No    Emotionally Abused: No    Physically Abused: No    Sexually Abused: No     Current Outpatient Medications:    naproxen (NAPROSYN) 375 MG tablet, Take 375 mg by mouth 2 (two) times daily., Disp: , Rfl:    omeprazole (PRILOSEC) 20 MG capsule, Take 1 capsule (20 mg total) by mouth daily., Disp: 90 capsule, Rfl: 1   PROAIR HFA 108 (90 Base) MCG/ACT inhaler, , Disp: , Rfl:    traZODone (DESYREL) 50 MG tablet, Take 1 tablet (50 mg total) by mouth at bedtime as needed for sleep., Disp: 90 tablet,  Rfl: 1   benzonatate (TESSALON) 100 MG capsule, Take 1 capsule (100 mg total) by mouth 2 (two) times daily as needed for cough. (Patient not taking: Reported on 06/05/2022), Disp: 20 capsule, Rfl: 0   busPIRone (BUSPAR) 5 MG tablet, Take 1-3 tablets (5-15 mg total) by mouth 2 (two) times daily as needed., Disp: 120 tablet, Rfl: 0   fluticasone (FLONASE) 50 MCG/ACT nasal spray, Place 2 sprays into both nostrils daily., Disp: 16 g, Rfl: 6   Lemborexant (DAYVIGO) 10 MG TABS, Take 10 mg by mouth at bedtime as needed. (Patient not taking: Reported on 12/31/2021), Disp: 30 tablet, Rfl: 5   Melatonin ER 5 MG TBCR, Take 5 mg by mouth at bedtime., Disp: 90 tablet, Rfl: 3   methylPREDNISolone (MEDROL DOSEPAK) 4 MG TBPK tablet, Day 1: Take 8 mg (2 tablets) before breakfast, 4 mg (1 tablet) after lunch, 4 mg (1 tablet) after supper, and 8 mg (2 tablets) at bedtime. Day 2:Take 4 mg (1 tablet) before breakfast, 4 mg (1 tablet) after lunch, 4 mg (1 tablet) after supper, and 8 mg (2 tablets) at bedtime. Day 3: Take 4 mg (1 tablet) before breakfast, 4 mg (1 tablet) after lunch, 4 mg (1 tablet) after supper, and 4 mg (1 tablet) at bedtime. Day 4: Take 4 mg (1 tablet) before breakfast, 4 mg (1 tablet) after lunch, and 4 mg (1 tablet) at bedtime. Day 5: Take 4 mg (1 tablet) before breakfast and 4 mg (  1 tablet) at bedtime. Day 6: Take 4 mg (1 tablet) before breakfast. (Patient not taking: Reported on 06/05/2022), Disp: 1 each, Rfl: 0  No Known Allergies   Review of Systems  All other systems reviewed and are negative.    Objective  Vitals:   06/05/22 0805  BP: 118/74  Pulse: 75  Resp: 16  Temp: 97.6 F (36.4 C)  TempSrc: Oral  SpO2: 95%  Weight: 195 lb 12.8 oz (88.8 kg)  Height: 5\' 8"  (1.727 m)    Body mass index is 29.77 kg/m.  Physical Exam Constitutional:      Appearance: Normal appearance.  HENT:     Head: Normocephalic and atraumatic.     Mouth/Throat:     Mouth: Mucous membranes are moist.      Pharynx: Oropharynx is clear.  Eyes:     Extraocular Movements: Extraocular movements intact.     Conjunctiva/sclera: Conjunctivae normal.     Pupils: Pupils are equal, round, and reactive to light.  Cardiovascular:     Rate and Rhythm: Normal rate and regular rhythm.  Pulmonary:     Effort: Pulmonary effort is normal.     Breath sounds: Normal breath sounds.  Musculoskeletal:     Right lower leg: No edema.     Left lower leg: No edema.  Lymphadenopathy:     Cervical: No cervical adenopathy.  Skin:    General: Skin is warm and dry.     Comments: Scattered freckles and cherry hemangiomas on back and shoulders, no suspicious lesions  Neurological:     General: No focal deficit present.     Mental Status: He is alert. Mental status is at baseline.  Psychiatric:        Mood and Affect: Mood normal.        Behavior: Behavior normal.     No results found for this or any previous visit (from the past 2160 hour(s)).   Fall Risk:    06/05/2022    8:00 AM 05/13/2022    3:02 PM 11/28/2021    8:48 AM 08/30/2021    1:51 PM 05/31/2021    8:25 AM  Fall Risk   Falls in the past year? 0 0 0 0 0  Number falls in past yr: 0 0 0 0 0  Injury with Fall? 0 0 0 0 0  Risk for fall due to : No Fall Risks      Follow up Falls prevention discussed;Education provided         Functional Status Survey: Is the patient deaf or have difficulty hearing?: No Does the patient have difficulty seeing, even when wearing glasses/contacts?: No Does the patient have difficulty concentrating, remembering, or making decisions?: No Does the patient have difficulty walking or climbing stairs?: No Does the patient have difficulty dressing or bathing?: No Does the patient have difficulty doing errands alone such as visiting a doctor's office or shopping?: No    Assessment & Plan  1. Annual physical exam/Prediabetes/Lipid screening/Prostate cancer screening: Annual labs and screening labs due today.  - CBC  w/Diff/Platelet - COMPLETE METABOLIC PANEL WITH GFR - Lipid Profile - PSA - HgB A1c  2. Need for influenza vaccination: Flu vaccine administered today.   - Flu Vaccine QUAD 4mo+IM (Fluarix, Fluzone & Alfiuria Quad PF)   -Prostate cancer screening and PSA options (with potential risks and benefits of testing vs not testing) were discussed along with recent recs/guidelines. -USPSTF grade A and B recommendations reviewed with patient; age-appropriate recommendations, preventive  care, screening tests, etc discussed and encouraged; healthy living encouraged; see AVS for patient education given to patient -Discussed importance of 150 minutes of physical activity weekly, eat two servings of fish weekly, eat one serving of tree nuts ( cashews, pistachios, pecans, almonds.Marland Kitchen) every other day, eat 6 servings of fruit/vegetables daily and drink plenty of water and avoid sweet beverages.  -Reviewed Health Maintenance: yes

## 2022-06-06 LAB — COMPLETE METABOLIC PANEL WITH GFR
AG Ratio: 1.9 (calc) (ref 1.0–2.5)
ALT: 23 U/L (ref 9–46)
AST: 17 U/L (ref 10–35)
Albumin: 4.3 g/dL (ref 3.6–5.1)
Alkaline phosphatase (APISO): 55 U/L (ref 35–144)
BUN: 17 mg/dL (ref 7–25)
CO2: 27 mmol/L (ref 20–32)
Calcium: 9.1 mg/dL (ref 8.6–10.3)
Chloride: 105 mmol/L (ref 98–110)
Creat: 1.22 mg/dL (ref 0.70–1.30)
Globulin: 2.3 g/dL (calc) (ref 1.9–3.7)
Glucose, Bld: 105 mg/dL — ABNORMAL HIGH (ref 65–99)
Potassium: 4.5 mmol/L (ref 3.5–5.3)
Sodium: 138 mmol/L (ref 135–146)
Total Bilirubin: 1.1 mg/dL (ref 0.2–1.2)
Total Protein: 6.6 g/dL (ref 6.1–8.1)
eGFR: 71 mL/min/{1.73_m2} (ref >=60)

## 2022-06-06 LAB — LIPID PANEL
Cholesterol: 154 mg/dL (ref <200)
HDL: 48 mg/dL (ref >=40)
LDL Cholesterol (Calc): 89 mg/dL (calc)
Non-HDL Cholesterol (Calc): 106 mg/dL (calc) (ref <130)
Total CHOL/HDL Ratio: 3.2 (calc) (ref <5.0)
Triglycerides: 77 mg/dL (ref <150)

## 2022-06-06 LAB — CBC WITH DIFFERENTIAL/PLATELET
Absolute Monocytes: 407 cells/uL (ref 200–950)
Basophils Absolute: 49 cells/uL (ref 0–200)
Basophils Relative: 1 %
Eosinophils Absolute: 172 cells/uL (ref 15–500)
Eosinophils Relative: 3.5 %
HCT: 40.5 % (ref 38.5–50.0)
Hemoglobin: 13.3 g/dL (ref 13.2–17.1)
Lymphs Abs: 1372 cells/uL (ref 850–3900)
MCH: 28.1 pg (ref 27.0–33.0)
MCHC: 32.8 g/dL (ref 32.0–36.0)
MCV: 85.4 fL (ref 80.0–100.0)
MPV: 10.8 fL (ref 7.5–12.5)
Monocytes Relative: 8.3 %
Neutro Abs: 2901 cells/uL (ref 1500–7800)
Neutrophils Relative %: 59.2 %
Platelets: 258 10*3/uL (ref 140–400)
RBC: 4.74 10*6/uL (ref 4.20–5.80)
RDW: 13.1 % (ref 11.0–15.0)
Total Lymphocyte: 28 %
WBC: 4.9 10*3/uL (ref 3.8–10.8)

## 2022-06-06 LAB — HEMOGLOBIN A1C
Hgb A1c MFr Bld: 5.8 % of total Hgb — ABNORMAL HIGH (ref <5.7)
Mean Plasma Glucose: 120 mg/dL
eAG (mmol/L): 6.6 mmol/L

## 2022-06-06 LAB — PSA: PSA: 0.38 ng/mL (ref <=4.00)

## 2022-06-18 ENCOUNTER — Encounter: Payer: Self-pay | Admitting: Internal Medicine

## 2022-06-18 DIAGNOSIS — G4700 Insomnia, unspecified: Secondary | ICD-10-CM

## 2022-06-20 MED ORDER — DAYVIGO 10 MG PO TABS: 10.0000 mg | ORAL_TABLET | Freq: Every evening | ORAL | 5 refills | Status: DC | PRN

## 2022-10-20 ENCOUNTER — Ambulatory Visit: Admitting: Internal Medicine

## 2022-10-20 ENCOUNTER — Encounter: Payer: Self-pay | Admitting: Internal Medicine

## 2022-10-20 VITALS — BP 116/74 | HR 82 | Temp 97.4°F | Resp 18 | Ht 68.0 in | Wt 197.0 lb

## 2022-10-20 DIAGNOSIS — J020 Streptococcal pharyngitis: Secondary | ICD-10-CM

## 2022-10-20 DIAGNOSIS — J029 Acute pharyngitis, unspecified: Secondary | ICD-10-CM | POA: Diagnosis not present

## 2022-10-20 DIAGNOSIS — J3489 Other specified disorders of nose and nasal sinuses: Secondary | ICD-10-CM

## 2022-10-20 LAB — POCT RAPID STREP A (OFFICE): Rapid Strep A Screen: NEGATIVE

## 2022-10-20 LAB — POCT INFLUENZA A/B
Influenza A, POC: NEGATIVE
Influenza B, POC: NEGATIVE

## 2022-10-20 MED ORDER — METHYLPREDNISOLONE 4 MG PO TBPK: ORAL_TABLET | ORAL | 0 refills | Status: DC

## 2022-10-20 NOTE — Progress Notes (Signed)
Acute Office Visit  Subjective:     Patient ID: Harold Forbes, male    DOB: [birth year], 52 y.o.   MRN: [id number]  Chief Complaint  Patient presents with   URI    Sore throat, ear pain, congestion x3 days    HPI Patient is in today for right ear pain, congestion. Symptoms have been going on for 3 days.   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  HENT:  Positive for congestion, ear pain and sore throat. Negative for ear discharge, sinus pain and tinnitus.   Respiratory:  Negative for cough, shortness of breath and wheezing.   Cardiovascular:  Negative for chest pain.        Objective:    BP 116/74   Pulse 82   Temp (!) 97.4 F (36.3 C)   Resp 18   Ht 5' 8"$  (1.727 m)   Wt 197 lb (89.4 kg)   SpO2 98%   BMI 29.95 kg/m    Physical Exam Constitutional:      Appearance: Normal appearance.  HENT:     Head: Normocephalic and atraumatic.     Right Ear: Tympanic membrane, ear canal and external ear normal.     Left Ear: Tympanic membrane, ear canal and external ear normal.     Nose: Congestion present.     Mouth/Throat:     Mouth: Mucous membranes are moist.     Pharynx: Posterior oropharyngeal erythema present.  Eyes:     Conjunctiva/sclera: Conjunctivae normal.  Cardiovascular:     Rate and Rhythm: Normal rate and regular rhythm.  Pulmonary:     Effort: Pulmonary effort is normal.     Breath sounds: Normal breath sounds.  Skin:    General: Skin is warm and dry.  Neurological:     General: No focal deficit present.     Mental  Status: He is alert. Mental status is at baseline.  Psychiatric:        Mood and Affect: Mood normal.        Behavior: Behavior normal.     No results found for any visits on 10/20/22.      Assessment & Plan:   1. Sore throat/Sinus pressure: Flu negative, POC strep negative as well. COVID and strep culture pending. For now will continue Flonase and will treat with Medrol dose pack.   - POCT Influenza A/B - Novel Coronavirus, NAA ([facility]) - methylPREDNISolone (MEDROL DOSEPAK) 4 MG TBPK tablet; Day 1: Take 8 mg (2 tablets) before breakfast, 4 mg (1 tablet) after lunch, 4 mg (1 tablet) after supper, and 8 mg (2 tablets) at bedtime. Day 2:Take 4 mg (1 tablet) before breakfast, 4 mg (1 tablet) after lunch, 4 mg (1 tablet) after supper, and 8 mg (2 tablets) at bedtime. Day 3: Take 4 mg (1 tablet) before breakfast, 4 mg (1 tablet) after lunch, 4 mg (1 tablet) after supper, and 4 mg (1 tablet) at bedtime. Day 4: Take 4 mg (1 tablet) before breakfast, 4 mg (1 tablet) after lunch, and 4 mg (1 tablet) at bedtime. Day 5: Take  4 mg (1 tablet) before breakfast and 4 mg (1 tablet) at bedtime. Day 6: Take 4 mg (1 tablet) before breakfast.  Dispense: 1 each; Refill: 0 - Culture, Group A Strep - POCT rapid strep A    Return if symptoms worsen or fail to improve.  Teodora Medici, DO

## 2022-10-22 LAB — CULTURE, GROUP A STREP
MICRO NUMBER:: 14583700
SPECIMEN QUALITY:: ADEQUATE

## 2022-10-22 LAB — NOVEL CORONAVIRUS, NAA: SARS-CoV-2, NAA: NOT DETECTED

## 2022-10-22 LAB — SPECIMEN STATUS REPORT

## 2022-10-22 MED ORDER — PENICILLIN V POTASSIUM 500 MG PO TABS: 500.0000 mg | ORAL_TABLET | Freq: Three times a day (TID) | ORAL | 0 refills | Status: AC

## 2022-10-22 NOTE — Addendum Note (Signed)
Addended by: Teodora Medici on: 10/22/2022 04:29 PM   Modules accepted: Orders

## 2022-12-19 ENCOUNTER — Other Ambulatory Visit: Payer: Self-pay | Admitting: Internal Medicine

## 2022-12-19 DIAGNOSIS — J069 Acute upper respiratory infection, unspecified: Secondary | ICD-10-CM

## 2022-12-19 NOTE — Telephone Encounter (Signed)
Requested Prescriptions  Pending Prescriptions Disp Refills   fluticasone (FLONASE) 50 MCG/ACT nasal spray [Pharmacy Med Name: FLUTICASONE NASAL SP (120) RX] 16 g 6    Sig: SHAKE LIQUID AND USE 2 SPRAYS IN EACH NOSTRIL DAILY     Ear, Nose, and Throat: Nasal Preparations - Corticosteroids Passed - 12/19/2022  3:16 AM      Passed - Valid encounter within last 12 months    Recent Outpatient Visits           2 months ago Strep throat   [facility] [name], DO   6 months ago Annual physical exam   [facility] [name], DO   7 months ago Viral upper respiratory tract infection   [facility] [name], DO   1 year ago Insomnia, unspecified type   [facility] Danelle Berry, PA-C   1 year ago Insomnia, unspecified type   [facility] [name], DO       Future Appointments             In 5 months [name], DO [facility]

## 2023-03-10 ENCOUNTER — Encounter: Payer: Self-pay | Admitting: Family Medicine

## 2023-03-10 ENCOUNTER — Ambulatory Visit: Admitting: Family Medicine

## 2023-03-10 VITALS — BP 118/80 | HR 75 | Temp 98.0°F | Resp 16 | Ht 68.0 in | Wt 195.5 lb

## 2023-03-10 DIAGNOSIS — K219 Gastro-esophageal reflux disease without esophagitis: Secondary | ICD-10-CM

## 2023-03-10 DIAGNOSIS — M25552 Pain in left hip: Secondary | ICD-10-CM | POA: Diagnosis not present

## 2023-03-10 DIAGNOSIS — G8929 Other chronic pain: Secondary | ICD-10-CM

## 2023-03-10 DIAGNOSIS — M5442 Lumbago with sciatica, left side: Secondary | ICD-10-CM

## 2023-03-10 DIAGNOSIS — G4700 Insomnia, unspecified: Secondary | ICD-10-CM | POA: Diagnosis not present

## 2023-03-10 DIAGNOSIS — F419 Anxiety disorder, unspecified: Secondary | ICD-10-CM

## 2023-03-10 DIAGNOSIS — M5441 Lumbago with sciatica, right side: Secondary | ICD-10-CM

## 2023-03-10 MED ORDER — VOQUEZNA 20 MG PO TABS: 20.0000 mg | ORAL_TABLET | Freq: Every day | ORAL | 1 refills | Status: DC | PRN

## 2023-03-10 MED ORDER — VOQUEZNA 20 MG PO TABS
20.0000 mg | ORAL_TABLET | Freq: Every day | ORAL | 1 refills | Status: DC | PRN
Start: 2023-03-10 — End: 2024-01-11

## 2023-03-10 NOTE — Progress Notes (Signed)
Patient ID: Harold Forbes, male    DOB: 08-09-1971, 52 y.o.   MRN: [id number]  PCP: [name], DO  Chief Complaint  Patient presents with   Back Pain    Onset for years, needs referral for [facility] for treatment again   Gastroesophageal Reflux    Voquezna 20mg  pt was given by a physician and so far it has helped     Subjective:   Harold Forbes is a 52 y.o. male, presents to clinic with CC of the following:  HPI  Back pain chronic  - he has been managing with ortho specialists but needs new insurance referral to go back into the office since last appt and ESI was 1 yr 3 months ago    Voquezna/Vonoprazan tablets 20 mg for GERD/indigestion he is also taking omeprazole, he got a trial bottle of the new med from an MD and it works very well for him, much better than omeprazole or protonix, he can take 1 dose a week and it helps sx better for longer He [facility] and [facility] for his longstading GERD sx, no difficulty swallowing, bowel changes, blood in stool, unintentional weight loss       Patient Active Problem List   Diagnosis Date Noted   Pure hypercholesterolemia 05/29/2020   Anxiety disorder 02/07/2020   Acute stress reaction 02/07/2020   Insomnia 02/07/2020   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/07/2020   Encounter for screening colonoscopy    Preventative health care 01/29/2018   Gastroesophageal reflux disease 01/29/2018   Screening for STDs (sexually transmitted diseases) 11/19/2016   Right elbow tendonitis 11/19/2016   Annual physical exam 11/19/2015   Frequent urination 11/19/2015      Current Outpatient Medications:    busPIRone (BUSPAR) 5 MG tablet, Take 1-3 tablets (5-15 mg total) by mouth 2 (two) times daily as needed., Disp: 120 tablet, Rfl: 0   cetirizine (ZYRTEC) 10 MG tablet, Take by mouth., Disp: , Rfl:    diclofenac Sodium (VOLTAREN) 1 % GEL, Apply topically., Disp: ,  Rfl:    emtricitabine-tenofovir AF (DESCOVY) 200-25 MG tablet, Take by mouth., Disp: , Rfl:    fluticasone (FLONASE) 50 MCG/ACT nasal spray, SHAKE LIQUID AND USE 2 SPRAYS IN EACH NOSTRIL DAILY, Disp: 16 g, Rfl: 2   naproxen (NAPROSYN) 375 MG tablet, Take 375 mg by mouth 2 (two) times daily., Disp: , Rfl:    omeprazole (PRILOSEC) 20 MG capsule, Take 1 capsule (20 mg total) by mouth daily., Disp: 90 capsule, Rfl: 1   PROAIR HFA 108 (90 Base) MCG/ACT inhaler, , Disp: , Rfl:    traZODone (DESYREL) 50 MG tablet, Take 1 tablet (50 mg total) by mouth at bedtime as needed for sleep., Disp: 90 tablet, Rfl: 1   Lemborexant (DAYVIGO) 10 MG TABS, Take 10 mg by mouth at bedtime as needed (for chronic insomnia). (Patient not taking: Reported on 03/10/2023), Disp: 30 tablet, Rfl: 5   Melatonin ER 5 MG TBCR, Take 5 mg by mouth at bedtime. (Patient not taking: Reported on 03/10/2023), Disp: 90 tablet, Rfl: 3   methylPREDNISolone (MEDROL DOSEPAK) 4 MG TBPK tablet, Day 1: Take 8 mg (2 tablets) before breakfast, 4 mg (1 tablet) after lunch, 4 mg (1 tablet) after supper, and 8 mg (2 tablets) at bedtime. Day 2:Take 4 mg (1 tablet) before breakfast, 4 mg (1 tablet) after lunch, 4 mg (1 tablet) after supper, and 8 mg (2 tablets)  at bedtime. Day 3: Take 4 mg (1 tablet) before breakfast, 4 mg (1 tablet) after lunch, 4 mg (1 tablet) after supper, and 4 mg (1 tablet) at bedtime. Day 4: Take 4 mg (1 tablet) before breakfast, 4 mg (1 tablet) after lunch, and 4 mg (1 tablet) at bedtime. Day 5: Take 4 mg (1 tablet) before breakfast and 4 mg (1 tablet) at bedtime. Day 6: Take 4 mg (1 tablet) before breakfast. (Patient not taking: Reported on 03/10/2023), Disp: 1 each, Rfl: 0   No Known Allergies   Social History   Tobacco Use   Smoking status: Never   Smokeless tobacco: Never  Vaping Use   Vaping Use: Never used  Substance Use Topics   Alcohol use: Yes    Alcohol/week: 4.0 standard drinks of alcohol    Types: 4 Standard drinks or  equivalent per week    Comment: occasional   Drug use: No      Chart Review Today: I personally reviewed active problem list, medication list, allergies, family history, social history, health maintenance, notes from last encounter, lab results, imaging with the patient/caregiver today.   Review of Systems  Constitutional: Negative.   HENT: Negative.    Eyes: Negative.   Respiratory: Negative.    Cardiovascular: Negative.   Gastrointestinal: Negative.   Endocrine: Negative.   Genitourinary: Negative.   Musculoskeletal: Negative.   Skin: Negative.   Allergic/Immunologic: Negative.   Neurological: Negative.   Hematological: Negative.   Psychiatric/Behavioral: Negative.    All other systems reviewed and are negative.      Objective:   Vitals:   03/10/23 1458  BP: 118/80  Pulse: 75  Resp: 16  Temp: 98 F (36.7 C)  TempSrc: Oral  SpO2: 96%  Weight: 195 lb 8 oz (88.7 kg)  Height: 5\' 8"  (1.727 m)    Body mass index is 29.73 kg/m.  Physical Exam Vitals and nursing note reviewed.  Constitutional:      Appearance: He is well-developed.  HENT:     Head: Normocephalic and atraumatic.     Nose: Nose normal.  Eyes:     General:        Right eye: No discharge.        Left eye: No discharge.     Conjunctiva/sclera: Conjunctivae normal.  Neck:     Trachea: No tracheal deviation.  Cardiovascular:     Rate and Rhythm: Normal rate and regular rhythm.  Pulmonary:     Effort: Pulmonary effort is normal. No respiratory distress.     Breath sounds: No stridor.  Musculoskeletal:        General: Normal range of motion.  Skin:    General: Skin is warm and dry.     Findings: No rash.  Neurological:     Mental Status: He is alert.     Motor: No abnormal muscle tone.     Coordination: Coordination normal.  Psychiatric:        Behavior: Behavior normal.      Results for orders placed or performed in visit on 10/20/22  Novel Coronavirus, NAA ([facility])   Specimen:  Nasopharyngeal(NP) swabs in vial transport medium   Nasopharynge  Previous  Result Value Ref Range   SARS-CoV-2, NAA Not Detected Not Detected  Culture, Group A Strep   Specimen: Throat  Result Value Ref Range   MICRO NUMBER: [id number]    SPECIMEN QUALITY: Adequate    SOURCE: THROAT    STATUS: FINAL    RESULT: Streptococcus  pyogenes (A)   Specimen status report  Result Value Ref Range   specimen status report Comment   POCT Influenza A/B  Result Value Ref Range   Influenza A, POC Negative Negative   Influenza B, POC Negative Negative  POCT rapid strep A  Result Value Ref Range   Rapid Strep A Screen Negative Negative       Assessment & Plan:     ICD-10-CM   1. Chronic midline low back pain with bilateral sciatica  M54.41 Ambulatory referral to Orthopedics   M54.42    G89.29    referral per insurance requirements to f/up with his MD who has moved to [place]    2. Left hip pain  M25.552 Ambulatory referral to Orthopedics   left hip pain over greater trochanter - bursitis vs IT band - offered prednisone - he prefers to go see ortho    3. Gastroesophageal reflux disease, unspecified whether esophagitis present  K21.9 Vonoprazan Fumarate (VOQUEZNA) 20 MG TABS   rx printed/sent to [place] - recommend deferring to GI after consult, may want to hold PPI for several weeks prior to procedure for acurate results    4. Insomnia, unspecified type  G47.00    still trying trazodone and he had improvement in sx with psychology    5. Anxiety disorder, unspecified type  F41.9    sx well controlled with psychology/therapist and buspar prn     Pt other meds and chronic conditions [facility] - med list reviewed and updated, but pt did not need [place] to rx any meds other than the new GI medicine noted above     Danelle Berry, PA-C 03/10/23 3:37 PM

## 2023-04-06 ENCOUNTER — Encounter: Payer: Self-pay | Admitting: Family Medicine

## 2023-04-08 NOTE — Telephone Encounter (Signed)
I have faxed today the printed prescription of VOQUENZA to the fax provided by patient

## 2023-06-09 ENCOUNTER — Other Ambulatory Visit: Payer: Self-pay | Admitting: Internal Medicine

## 2023-06-09 ENCOUNTER — Encounter: Payer: Self-pay | Admitting: Internal Medicine

## 2023-06-09 ENCOUNTER — Ambulatory Visit (INDEPENDENT_AMBULATORY_CARE_PROVIDER_SITE_OTHER): Admitting: Internal Medicine

## 2023-06-09 VITALS — BP 120/84 | HR 73 | Temp 98.0°F | Resp 18 | Ht 68.0 in | Wt 196.0 lb

## 2023-06-09 DIAGNOSIS — Z23 Encounter for immunization: Secondary | ICD-10-CM

## 2023-06-09 DIAGNOSIS — R7303 Prediabetes: Secondary | ICD-10-CM | POA: Diagnosis not present

## 2023-06-09 DIAGNOSIS — R351 Nocturia: Secondary | ICD-10-CM

## 2023-06-09 DIAGNOSIS — Z13220 Encounter for screening for lipoid disorders: Secondary | ICD-10-CM

## 2023-06-09 DIAGNOSIS — Z125 Encounter for screening for malignant neoplasm of prostate: Secondary | ICD-10-CM

## 2023-06-09 DIAGNOSIS — N401 Enlarged prostate with lower urinary tract symptoms: Secondary | ICD-10-CM

## 2023-06-09 DIAGNOSIS — Z0001 Encounter for general adult medical examination with abnormal findings: Secondary | ICD-10-CM

## 2023-06-09 DIAGNOSIS — Z0000 Encounter for general adult medical examination without abnormal findings: Secondary | ICD-10-CM

## 2023-06-09 MED ORDER — TAMSULOSIN HCL 0.4 MG PO CAPS
0.4000 mg | ORAL_CAPSULE | Freq: Every day | ORAL | 0 refills | Status: DC
Start: 2023-06-09 — End: 2023-07-07

## 2023-06-09 NOTE — Telephone Encounter (Signed)
Medication was refilled 06/09/23, duplicate request.  Requested Prescriptions  Pending Prescriptions Disp Refills   tamsulosin (FLOMAX) 0.4 MG CAPS capsule [Pharmacy Med Name: TAMSULOSIN 0.4MG  CAPSULES] 90 capsule     Sig: TAKE 1 CAPSULE(0.4 MG) BY MOUTH DAILY     Urology: Alpha-Adrenergic Blocker Failed - 06/09/2023  9:01 AM      Failed - PSA in normal range and within 360 days    PSA  Date Value Ref Range Status  06/05/2022 0.38 < OR = 4.00 ng/mL Final    Comment:    The total PSA value from this assay system is  standardized against the WHO standard. The test  result will be approximately 20% lower when compared  to the equimolar-standardized total PSA (Beckman  Coulter). Comparison of serial PSA results should be  interpreted with this fact in mind. . This test was performed using the Siemens  chemiluminescent method. Values obtained from  different assay methods cannot be used interchangeably. PSA levels, regardless of value, should not be interpreted as absolute evidence of the presence or absence of disease.          Passed - Last BP in normal range    BP Readings from Last 1 Encounters:  06/09/23 120/84         Passed - Valid encounter within last 12 months    Recent Outpatient Visits           Today Annual physical exam   [facility] [name], DO   3 months ago Chronic midline low back pain with bilateral sciatica   [facility] Danelle Berry, PA-C   7 months ago Strep throat   [facility] [name], DO   1 year ago Annual physical exam   [facility] [name], DO   1 year ago Viral upper respiratory tract infection   [facility] [name], [place]

## 2023-06-10 LAB — HEMOGLOBIN A1C
Hgb A1c MFr Bld: 5.9 %{Hb} — ABNORMAL HIGH (ref <5.7)
Mean Plasma Glucose: 123 mg/dL
eAG (mmol/L): 6.8 mmol/L

## 2023-06-10 LAB — CBC WITH DIFFERENTIAL/PLATELET
Absolute Monocytes: 636 {cells}/uL (ref 200–950)
Basophils Absolute: 52 {cells}/uL (ref 0–200)
Basophils Relative: 0.7 %
Eosinophils Absolute: 192 {cells}/uL (ref 15–500)
Eosinophils Relative: 2.6 %
HCT: 41.6 % (ref 38.5–50.0)
Hemoglobin: 13 g/dL — ABNORMAL LOW (ref 13.2–17.1)
Lymphs Abs: 1554 {cells}/uL (ref 850–3900)
MCH: 26.7 pg — ABNORMAL LOW (ref 27.0–33.0)
MCHC: 31.3 g/dL — ABNORMAL LOW (ref 32.0–36.0)
MCV: 85.4 fL (ref 80.0–100.0)
MPV: 11.2 fL (ref 7.5–12.5)
Monocytes Relative: 8.6 %
Neutro Abs: 4965 {cells}/uL (ref 1500–7800)
Neutrophils Relative %: 67.1 %
Platelets: 305 10*3/uL (ref 140–400)
RBC: 4.87 10*6/uL (ref 4.20–5.80)
RDW: 13.4 % (ref 11.0–15.0)
Total Lymphocyte: 21 %
WBC: 7.4 10*3/uL (ref 3.8–10.8)

## 2023-06-10 LAB — COMPLETE METABOLIC PANEL WITH GFR
AG Ratio: 1.8 (calc) (ref 1.0–2.5)
ALT: 26 U/L (ref 9–46)
AST: 23 U/L (ref 10–35)
Albumin: 4.4 g/dL (ref 3.6–5.1)
Alkaline phosphatase (APISO): 62 U/L (ref 35–144)
BUN: 15 mg/dL (ref 7–25)
CO2: 26 mmol/L (ref 20–32)
Calcium: 9.4 mg/dL (ref 8.6–10.3)
Chloride: 104 mmol/L (ref 98–110)
Creat: 1.27 mg/dL (ref 0.70–1.30)
Globulin: 2.4 g/dL (ref 1.9–3.7)
Glucose, Bld: 105 mg/dL — ABNORMAL HIGH (ref 65–99)
Potassium: 4.4 mmol/L (ref 3.5–5.3)
Sodium: 138 mmol/L (ref 135–146)
Total Bilirubin: 1.4 mg/dL — ABNORMAL HIGH (ref 0.2–1.2)
Total Protein: 6.8 g/dL (ref 6.1–8.1)
eGFR: 67 mL/min/{1.73_m2} (ref >=60)

## 2023-06-10 LAB — LIPID PANEL
Cholesterol: 168 mg/dL (ref <200)
HDL: 56 mg/dL (ref >=40)
LDL Cholesterol (Calc): 95 mg/dL
Non-HDL Cholesterol (Calc): 112 mg/dL (ref <130)
Total CHOL/HDL Ratio: 3 (calc) (ref <5.0)
Triglycerides: 81 mg/dL (ref <150)

## 2023-06-10 LAB — PSA: PSA: 0.3 ng/mL (ref <=4.00)

## 2023-06-11 ENCOUNTER — Other Ambulatory Visit: Payer: Self-pay

## 2023-06-11 DIAGNOSIS — N401 Enlarged prostate with lower urinary tract symptoms: Secondary | ICD-10-CM

## 2023-07-06 ENCOUNTER — Other Ambulatory Visit: Payer: Self-pay | Admitting: Internal Medicine

## 2023-07-06 DIAGNOSIS — N401 Enlarged prostate with lower urinary tract symptoms: Secondary | ICD-10-CM

## 2023-07-07 NOTE — Telephone Encounter (Signed)
Requested Prescriptions  Pending Prescriptions Disp Refills   tamsulosin (FLOMAX) 0.4 MG CAPS capsule [Pharmacy Med Name: TAMSULOSIN 0.4MG  CAPSULES] 90 capsule 2    Sig: TAKE 1 CAPSULE(0.4 MG) BY MOUTH DAILY     Urology: Alpha-Adrenergic Blocker Passed - 07/06/2023  5:06 PM      Passed - PSA in normal range and within 360 days    PSA  Date Value Ref Range Status  06/09/2023 0.30 < OR = 4.00 ng/mL Final    Comment:    The total PSA value from this assay system is  standardized against the WHO standard. The test  result will be approximately 20% lower when compared  to the equimolar-standardized total PSA (Beckman  Coulter). Comparison of serial PSA results should be  interpreted with this fact in mind. . This test was performed using the Siemens  chemiluminescent method. Values obtained from  different assay methods cannot be used interchangeably. PSA levels, regardless of value, should not be interpreted as absolute evidence of the presence or absence of disease.          Passed - Last BP in normal range    BP Readings from Last 1 Encounters:  06/09/23 120/84         Passed - Valid encounter within last 12 months    Recent Outpatient Visits           4 weeks ago Annual physical exam   [facility] [name], DO   3 months ago Chronic midline low back pain with bilateral sciatica   [facility] Danelle Berry, PA-C   8 months ago Strep throat   [facility] [name], DO   1 year ago Annual physical exam   [facility] [name], DO   1 year ago Viral upper respiratory tract infection   [facility] [name], [place]

## 2023-12-10 ENCOUNTER — Ambulatory Visit (INDEPENDENT_AMBULATORY_CARE_PROVIDER_SITE_OTHER): Admitting: Internal Medicine

## 2023-12-10 ENCOUNTER — Other Ambulatory Visit: Payer: Self-pay

## 2023-12-10 ENCOUNTER — Encounter: Payer: Self-pay | Admitting: Internal Medicine

## 2023-12-10 VITALS — BP 120/72 | HR 97 | Temp 98.1°F | Resp 18 | Ht 68.0 in | Wt 200.3 lb

## 2023-12-10 DIAGNOSIS — R051 Acute cough: Secondary | ICD-10-CM

## 2023-12-10 DIAGNOSIS — J020 Streptococcal pharyngitis: Secondary | ICD-10-CM

## 2023-12-10 DIAGNOSIS — J029 Acute pharyngitis, unspecified: Secondary | ICD-10-CM | POA: Diagnosis not present

## 2023-12-10 LAB — POC COVID19/FLU A&B COMBO
Covid Antigen, POC: NEGATIVE
Influenza A Antigen, POC: NEGATIVE
Influenza B Antigen, POC: NEGATIVE

## 2023-12-10 LAB — POCT RAPID STREP A (OFFICE): Rapid Strep A Screen: POSITIVE — AB

## 2023-12-10 MED ORDER — TRELEGY ELLIPTA 100-62.5-25 MCG/ACT IN AEPB
1.0000 | INHALATION_SPRAY | Freq: Every day | RESPIRATORY_TRACT | Status: DC
Start: 2023-12-10 — End: 2024-01-11

## 2023-12-10 MED ORDER — BENZONATATE 100 MG PO CAPS
100.0000 mg | ORAL_CAPSULE | Freq: Two times a day (BID) | ORAL | 0 refills | Status: DC | PRN
Start: 2023-12-10 — End: 2024-01-11

## 2023-12-10 MED ORDER — AMOXICILLIN 500 MG PO CAPS
500.0000 mg | ORAL_CAPSULE | Freq: Two times a day (BID) | ORAL | 0 refills | Status: AC
Start: 2023-12-10 — End: 2023-12-20

## 2023-12-10 NOTE — Progress Notes (Signed)
 Acute Office Visit  Subjective:     Patient ID: Harold Forbes, male    DOB: 12-Jul-1971, 53 y.o.   MRN: [id number]  Chief Complaint  Patient presents with   URI    Congestion for 1 week    URI  Associated symptoms include coughing, a sore throat and wheezing. Pertinent negatives include no congestion or sinus pain.   Patient is in today for cough.  Discussed the use of AI scribe software for clinical note transcription with the patient, who gave verbal consent to proceed.  History of Present Illness The patient, with a history of asthma and lower back arthritis, presents with a cough and chest congestion that started about a week ago after a trip to [place]. The patient reports staying in a hotel that had a moldy smell, and since then, he and his travel companion, who is a doctor, have been coughing. The patient's cough is productive, with yellow to light green phlegm. He also reports shortness of breath, especially when walking up stairs, but no shortness of breath at rest. The patient has been using his Albuterol inhaler more frequently than usual, about three to four times a day. He also reports a slight sore throat that morning, but no nasal congestion, sinus pain, or ear pain. The patient has been taking over-the-counter cold and flu medication. He also mentions a close work contact who has been diagnosed with strep throat.   Review of Systems  Constitutional:  Negative for chills and fever.  HENT:  Positive for sore throat. Negative for congestion and sinus pain.   Respiratory:  Positive for cough, sputum production, shortness of breath and wheezing.         Objective:    BP 120/72 (Cuff Size: Large)   Pulse 97   Temp 98.1 F (36.7 C) (Oral)   Resp 18   Ht 5\' 8"  (1.727 m)   Wt 200 lb 4.8 oz (90.9 kg)   SpO2 97%   BMI 30.46 kg/m  BP Readings from Last 3 Encounters:  12/10/23 120/72  06/09/23 120/84  03/10/23 118/80   Wt Readings from Last 3 Encounters:  12/10/23  200 lb 4.8 oz (90.9 kg)  06/09/23 196 lb (88.9 kg)  03/10/23 195 lb 8 oz (88.7 kg)      Physical Exam Constitutional:      Appearance: Normal appearance.  HENT:     Head: Normocephalic and atraumatic.     Right Ear: Tympanic membrane, ear canal and external ear normal.     Left Ear: Tympanic membrane, ear canal and external ear normal.     Nose: Nose normal.     Mouth/Throat:     Mouth: Mucous membranes are moist.     Pharynx: Posterior oropharyngeal erythema present.  Eyes:     Conjunctiva/sclera: Conjunctivae normal.  Cardiovascular:     Rate and Rhythm: Normal rate and regular rhythm.  Pulmonary:     Effort: Pulmonary effort is normal.     Breath sounds: Normal breath sounds. No wheezing, rhonchi or rales.  Skin:    General: Skin is warm and dry.  Neurological:     General: No focal deficit present.     Mental Status: He is alert. Mental status is at baseline.  Psychiatric:        Mood and Affect: Mood normal.        Behavior: Behavior normal.     No results found for any visits on 12/10/23.      Assessment &  Plan:   Assessment & Plan Asthma exacerbation Acute exacerbation likely due to mold exposure or viral illness. Increased albuterol use, cough with yellow sputum, exertional dyspnea. Lungs clear. - Provide Trelegy inhaler sample for two weeks, morning use. - Instruct to rinse mouth after Trelegy to prevent thrush. - Continue albuterol as needed. - Prescribe cough suppressant. - Consider oral steroid pack if inhaler insufficient. - Test for influenza and COVID-19 which were negative. - Advise to avoid exercise for three days, then resume light weights and gradually reintroduce cardio.  Streptococcal pharyngitis Slight sore throat with recent strep exposure. Throat redness present, minimal pain. Testing warranted. - Test for streptococcal pharyngitis positive - Treat with Amoxicillin 500 mg BID x 10 days   - amoxicillin (AMOXIL) 500 MG capsule; Take 1  capsule (500 mg total) by mouth 2 (two) times daily for 10 days.  Dispense: 20 capsule; Refill: 0 - benzonatate (TESSALON) 100 MG capsule; Take 1 capsule (100 mg total) by mouth 2 (two) times daily as needed for cough.  Dispense: 20 capsule; Refill: 0 - POCT rapid strep A - POC Covid19/Flu A&B Antigen - Fluticasone-Umeclidin-Vilant (TRELEGY ELLIPTA) 100-62.5-25 MCG/ACT AEPB; Inhale 1 puff into the lungs daily.    Return if symptoms worsen or fail to improve.  [name], DO

## 2024-01-11 ENCOUNTER — Ambulatory Visit: Admitting: Internal Medicine

## 2024-01-11 ENCOUNTER — Other Ambulatory Visit: Payer: Self-pay

## 2024-01-11 ENCOUNTER — Encounter: Payer: Self-pay | Admitting: Internal Medicine

## 2024-01-11 VITALS — BP 120/78 | HR 70 | Temp 98.1°F | Resp 18 | Ht 68.0 in | Wt 196.7 lb

## 2024-01-11 DIAGNOSIS — Z0289 Encounter for other administrative examinations: Secondary | ICD-10-CM

## 2024-01-11 DIAGNOSIS — F4310 Post-traumatic stress disorder, unspecified: Secondary | ICD-10-CM

## 2024-01-11 DIAGNOSIS — M5431 Sciatica, right side: Secondary | ICD-10-CM | POA: Diagnosis not present

## 2024-01-11 DIAGNOSIS — G4700 Insomnia, unspecified: Secondary | ICD-10-CM | POA: Diagnosis not present

## 2024-01-11 DIAGNOSIS — K219 Gastro-esophageal reflux disease without esophagitis: Secondary | ICD-10-CM

## 2024-01-11 DIAGNOSIS — M5432 Sciatica, left side: Secondary | ICD-10-CM

## 2024-01-11 DIAGNOSIS — D509 Iron deficiency anemia, unspecified: Secondary | ICD-10-CM

## 2024-01-11 NOTE — Progress Notes (Signed)
 Acute Office Visit  Subjective:     Patient ID: Harold Forbes, male    DOB: November 16, 1970, 53 y.o.   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   Discussed the use of AI scribe software for clinical note transcription with the patient, who gave verbal consent to proceed.  History of Present Illness Masaki Padley is a 53 year old male with arthritis, PTSD, and asthma who presents for a follow-up regarding his medical conditions and military deployment status.  He experiences back pain due to arthritis and sciatica in his lower back, worsened by prolonged standing. He receives steroid injections as needed. He also has left hip bursitis and bilateral sciatica, more severe on the left.  He has PTSD, anxiety, and depression related to deployment experiences. He takes Buspar  10 mg at night for anxiety.  He has asthma and uses an inhaler during physical activities. He uses Trelegy as needed, especially when ill.  He manages acid reflux with omeprazole  and takes trazodone  75 mg at night for insomnia. He is anemic and takes a multivitamin with iron. His medications include Flomax , Zyrtec, and Prilosec.    Review of Systems  All other systems reviewed and are negative.       Objective:    BP 120/78 (Cuff Size: Large)   Pulse 70   Temp 98.1 F (36.7 C) (Oral)   Resp 18   Ht 5\' 8"  (1.727 m)   Wt 196 lb 11.2 oz (89.2 kg)   SpO2 97%   BMI 29.91 kg/m  BP Readings from Last 3 Encounters:  01/11/24 120/78  12/10/23 120/72  06/09/23 120/84   Wt Readings from Last 3 Encounters:  01/11/24 196 lb 11.2 oz (89.2 kg)  12/10/23 200 lb 4.8 oz (90.9 kg)  06/09/23 196 lb (88.9 kg)      Physical Exam Constitutional:      Appearance: Normal appearance.  HENT:     Head: Normocephalic and atraumatic.     Mouth/Throat:     Mouth: Mucous  membranes are moist.     Pharynx: Oropharynx is clear.  Eyes:     Extraocular Movements: Extraocular movements intact.     Conjunctiva/sclera: Conjunctivae normal.     Pupils: Pupils are equal, round, and reactive to light.  Neck:     Comments: No thyromegaly Cardiovascular:     Rate and Rhythm: Normal rate and regular rhythm.  Pulmonary:     Effort: Pulmonary effort is normal.     Breath sounds: Normal breath sounds.  Musculoskeletal:     Cervical back: No tenderness.     Right lower leg: No edema.     Left lower leg: No edema.  Lymphadenopathy:     Cervical: No cervical adenopathy.  Skin:    General: Skin is warm and dry.  Neurological:     General: No focal deficit present.     Mental Status: He is alert. Mental status is at baseline.  Psychiatric:        Mood and Affect: Mood normal.        Behavior: Behavior normal.     No results found for any visits on 01/11/24.      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 Significant pain, especially during cardio activities. Previous steroid injections for bursitis. - Recommend naproxen or ibuprofen for pain management. - Consider steroid injections for persistent bursitis pain. - Advise against running due to back pain and previous orthopedic advice. - Limit lifting to 10 pounds, avoid standing or walking over 30 minutes.  PTSD with anxiety and depression Chronic PTSD with anxiety and depression, primarily affecting him at night. Buspar  taken as needed for anxiety. - Continue Buspar  10 mg at night as needed for anxiety.  Insomnia Chronic insomnia managed with trazodone . - Continue trazodone  75 mg at night for insomnia.  Acid reflux Chronic acid reflux managed with omeprazole . - Continue omeprazole  for acid reflux management.  Anemia Chronic anemia since childhood, managed with a  multivitamin containing iron. - Continue multivitamin with iron for anemia management.  Forms completed with the patient and scanned.     Return in about 5 months (around 06/12/2024) for physical.  Rockney Cid, DO

## 2024-01-18 NOTE — Progress Notes (Signed)
 BP 126/72 (Cuff Size: Large)   Pulse 92   Temp 98.2 F (36.8 C) (Oral)   Resp 18   Ht 5\' 8"  (1.727 m)   Wt 197 lb 3.2 oz (89.4 kg)   SpO2 94%   BMI 29.98 kg/m    Subjective:    Patient ID: Harold Forbes, male    DOB: 08-14-1971, 53 y.o.   MRN: [id number]  HPI: Harold Forbes is a 53 y.o. male  Chief Complaint  Patient presents with   URI    Cough, congested, chest tightness for 1 week    Discussed the use of AI scribe software for clinical note transcription with the patient, who gave verbal consent to proceed.  History of Present Illness Harold Forbes is a 53 year old male with asthma who presents with upper respiratory symptoms and chest congestion.  He has been experiencing chest congestion and difficulty breathing for approximately one week. He attributes the exacerbation of symptoms to exposure to grass [place] [employer] and wonders if it could be related to allergies.  He has a history of asthma and was previously prescribed Trelegy, which he used successfully in February to alleviate chest tightness. He used Trelegy again over the past two nights, which provided some relief, but he continues to feel winded and short of breath. He did not bring his Trelegy with him over the weekend.   No fever or other systemic symptoms.         01/11/2024    3:18 PM 06/09/2023    8:17 AM 03/10/2023    2:58 PM  Depression screen PHQ 2/9  Decreased Interest 0 0 0  Down, Depressed, Hopeless 0 0 0  PHQ - 2 Score 0 0 0  Altered sleeping  0 0  Tired, decreased energy  0 0  Change in appetite  0 0  Feeling bad or failure about yourself   0 0  Trouble concentrating  0 0  Moving slowly or fidgety/restless  0 0  Suicidal thoughts  0 0  PHQ-9 Score  0 0  Difficult doing work/chores  Not difficult at all Not difficult at all    Relevant past medical, surgical, family and social history reviewed and updated as indicated. Interim medical history since our last visit  reviewed. Allergies and medications reviewed and updated.  Review of Systems Ten systems reviewed and is negative except as mentioned in HPI       Objective:      BP 126/72 (Cuff Size: Large)   Pulse 92   Temp 98.2 F (36.8 C) (Oral)   Resp 18   Ht 5\' 8"  (1.727 m)   Wt 197 lb 3.2 oz (89.4 kg)   SpO2 94%   BMI 29.98 kg/m    Wt Readings from Last 3 Encounters:  01/19/24 197 lb 3.2 oz (89.4 kg)  01/11/24 196 lb 11.2 oz (89.2 kg)  12/10/23 200 lb 4.8 oz (90.9 kg)    Physical Exam Vitals reviewed.  Constitutional:      Appearance: Normal appearance.  HENT:     Head: Normocephalic.  Cardiovascular:     Rate and Rhythm: Normal rate and regular rhythm.  Pulmonary:     Effort: Pulmonary effort is normal.     Breath sounds: Wheezing present.  Musculoskeletal:        General: Normal range of motion.  Skin:    General: Skin is warm and dry.  Neurological:     General: No focal  deficit present.     Mental Status: He is alert and oriented to person, place, and time. Mental status is at baseline.  Psychiatric:        Mood and Affect: Mood normal.        Behavior: Behavior normal.        Thought Content: Thought content normal.        Judgment: Judgment normal.              Assessment & Plan:   Problem List Items Addressed This Visit       Respiratory   Moderate persistent asthma with acute exacerbation - Primary   Relevant Medications   predniSONE  (STERAPRED UNI-PAK 21 TAB) 10 MG (21) TBPK tablet   benzonatate  (TESSALON ) 100 MG capsule   Albuterol-Budesonide (AIRSUPRA) 90-80 MCG/ACT AERO   promethazine-dextromethorphan (PROMETHAZINE-DM) 6.25-15 MG/5ML syrup     Assessment and Plan Assessment & Plan Asthma Asthma exacerbation likely triggered by recent exposure to grass allergens. Symptoms include chest congestion, difficulty breathing, and wheezing. Previous use of Trelegy provided relief, but symptoms persist. Discussed the use of Airsupra, a new rescue  inhaler containing albuterol and a steroid, to address both bronchoconstriction and inflammation. Airsupra is expected to provide immediate relief and reduce inflammation, and it is compatible with Trelegy. - Prescribe Airsupra as a rescue inhaler. - Provide a coupon for Airsupra. - Continue Trelegy until symptoms improve. Sample provided - Initiate a steroid taper. - Instruct to report back if symptoms do not improve.         Follow up plan: Return if symptoms worsen or fail to improve.

## 2024-01-19 ENCOUNTER — Other Ambulatory Visit: Payer: Self-pay

## 2024-01-19 ENCOUNTER — Ambulatory Visit: Admitting: Nurse Practitioner

## 2024-01-19 ENCOUNTER — Encounter: Payer: Self-pay | Admitting: Nurse Practitioner

## 2024-01-19 VITALS — BP 126/72 | HR 92 | Temp 98.2°F | Resp 18 | Ht 68.0 in | Wt 197.2 lb

## 2024-01-19 DIAGNOSIS — J4541 Moderate persistent asthma with (acute) exacerbation: Secondary | ICD-10-CM | POA: Diagnosis not present

## 2024-01-19 MED ORDER — AIRSUPRA 90-80 MCG/ACT IN AERO
1.0000 | INHALATION_SPRAY | RESPIRATORY_TRACT | 5 refills | Status: DC | PRN
Start: 2024-01-19 — End: 2024-01-27

## 2024-01-19 MED ORDER — PREDNISONE 10 MG (21) PO TBPK
ORAL_TABLET | ORAL | 0 refills | Status: DC
Start: 2024-01-19 — End: 2024-06-13

## 2024-01-19 MED ORDER — PROMETHAZINE-DM 6.25-15 MG/5ML PO SYRP: 5.0000 mL | ORAL_SOLUTION | Freq: Four times a day (QID) | ORAL | 0 refills | Status: DC | PRN

## 2024-01-19 MED ORDER — BENZONATATE 100 MG PO CAPS
200.0000 mg | ORAL_CAPSULE | Freq: Two times a day (BID) | ORAL | 0 refills | Status: DC | PRN
Start: 2024-01-19 — End: 2024-06-13

## 2024-01-27 ENCOUNTER — Encounter: Payer: Self-pay | Admitting: Nurse Practitioner

## 2024-01-27 DIAGNOSIS — J4541 Moderate persistent asthma with (acute) exacerbation: Secondary | ICD-10-CM

## 2024-01-27 MED ORDER — AIRSUPRA 90-80 MCG/ACT IN AERO
1.0000 | INHALATION_SPRAY | RESPIRATORY_TRACT | 5 refills | Status: DC | PRN
Start: 2024-01-27 — End: 2024-06-13

## 2024-03-31 ENCOUNTER — Other Ambulatory Visit: Payer: Self-pay | Admitting: Internal Medicine

## 2024-03-31 DIAGNOSIS — N401 Enlarged prostate with lower urinary tract symptoms: Secondary | ICD-10-CM

## 2024-03-31 NOTE — Telephone Encounter (Signed)
 Requested Prescriptions  Pending Prescriptions Disp Refills   tamsulosin  (FLOMAX ) 0.4 MG CAPS capsule [Pharmacy Med Name: TAMSULOSIN  0.4MG  CAPSULES] 90 capsule 0    Sig: TAKE 1 CAPSULE(0.4 MG) BY MOUTH DAILY     Urology: Alpha-Adrenergic Blocker Passed - 03/31/2024  4:05 PM      Passed - PSA in normal range and within 360 days    PSA  Date Value Ref Range Status  06/09/2023 0.30 < OR = 4.00 ng/mL Final    Comment:    The total PSA value from this assay system is  standardized against the WHO standard. The test  result will be approximately 20% lower when compared  to the equimolar-standardized total PSA (Beckman  Coulter). Comparison of serial PSA results should be  interpreted with this fact in mind. . This test was performed using the Siemens  chemiluminescent method. Values obtained from  different assay methods cannot be used interchangeably. PSA levels, regardless of value, should not be interpreted as absolute evidence of the presence or absence of disease.          Passed - Last BP in normal range    BP Readings from Last 1 Encounters:  01/19/24 126/72         Passed - Valid encounter within last 12 months    Recent Outpatient Visits           2 months ago Moderate persistent asthma with acute exacerbation   [facility] Gareth Mliss FALCON, FNP   2 months ago Encounter for completion of form with patient   [facility] Bernardo Fend, DO   3 months ago Strep throat   [facility] Bernardo Fend, [place]

## 2024-04-08 ENCOUNTER — Other Ambulatory Visit: Payer: Self-pay | Admitting: Internal Medicine

## 2024-04-08 DIAGNOSIS — N401 Enlarged prostate with lower urinary tract symptoms: Secondary | ICD-10-CM

## 2024-04-12 NOTE — Telephone Encounter (Signed)
 Too soon for refill, duplicate request, [facility] 03/30/24.  Requested Prescriptions  Pending Prescriptions Disp Refills   tamsulosin  (FLOMAX ) 0.4 MG CAPS capsule [Pharmacy Med Name: TAMSULOSIN  0.4MG  CAPSULES] 90 capsule 0    Sig: TAKE 1 CAPSULE(0.4 MG) BY MOUTH DAILY     Urology: Alpha-Adrenergic Blocker Passed - 04/12/2024 12:55 PM      Passed - PSA in normal range and within 360 days    PSA  Date Value Ref Range Status  06/09/2023 0.30 < OR = 4.00 ng/mL Final    Comment:    The total PSA value from this assay system is  standardized against the WHO standard. The test  result will be approximately 20% lower when compared  to the equimolar-standardized total PSA (Beckman  Coulter). Comparison of serial PSA results should be  interpreted with this fact in mind. . This test was performed using the Siemens  chemiluminescent method. Values obtained from  different assay methods cannot be used interchangeably. PSA levels, regardless of value, should not be interpreted as absolute evidence of the presence or absence of disease.          Passed - Last BP in normal range    BP Readings from Last 1 Encounters:  01/19/24 126/72         Passed - Valid encounter within last 12 months    Recent Outpatient Visits           2 months ago Moderate persistent asthma with acute exacerbation   [facility] Gareth Mliss FALCON, FNP   3 months ago Encounter for completion of form with patient   [facility] Bernardo Fend, DO   4 months ago Strep throat   [facility] Bernardo Fend, [place]

## 2024-06-13 ENCOUNTER — Encounter: Payer: Self-pay | Admitting: Internal Medicine

## 2024-06-13 ENCOUNTER — Ambulatory Visit (INDEPENDENT_AMBULATORY_CARE_PROVIDER_SITE_OTHER): Admitting: Internal Medicine

## 2024-06-13 ENCOUNTER — Other Ambulatory Visit: Payer: Self-pay

## 2024-06-13 VITALS — BP 120/76 | HR 87 | Temp 98.3°F | Resp 16 | Ht 68.0 in | Wt 196.4 lb

## 2024-06-13 DIAGNOSIS — J4540 Moderate persistent asthma, uncomplicated: Secondary | ICD-10-CM | POA: Diagnosis not present

## 2024-06-13 DIAGNOSIS — Z23 Encounter for immunization: Secondary | ICD-10-CM | POA: Diagnosis not present

## 2024-06-13 DIAGNOSIS — Z0001 Encounter for general adult medical examination with abnormal findings: Secondary | ICD-10-CM

## 2024-06-13 DIAGNOSIS — R7303 Prediabetes: Secondary | ICD-10-CM | POA: Diagnosis not present

## 2024-06-13 DIAGNOSIS — Z125 Encounter for screening for malignant neoplasm of prostate: Secondary | ICD-10-CM

## 2024-06-13 DIAGNOSIS — Z13220 Encounter for screening for lipoid disorders: Secondary | ICD-10-CM

## 2024-06-13 DIAGNOSIS — Z0000 Encounter for general adult medical examination without abnormal findings: Secondary | ICD-10-CM

## 2024-06-13 MED ORDER — COVID-19 MRNA VAC-TRIS(PFIZER) 30 MCG/0.3ML IM SUSY: 0.3000 mL | PREFILLED_SYRINGE | Freq: Once | INTRAMUSCULAR | 0 refills | Status: AC

## 2024-06-13 MED ORDER — ALBUTEROL SULFATE HFA 108 (90 BASE) MCG/ACT IN AERS: 2.0000 | INHALATION_SPRAY | Freq: Four times a day (QID) | RESPIRATORY_TRACT | 0 refills | Status: DC | PRN

## 2024-06-13 NOTE — Progress Notes (Signed)
 Name: Harold Forbes   MRN: [id number]    DOB: 10-21-1970   Date:06/13/2024       Progress Note  Subjective  Chief Complaint  Chief Complaint  Patient presents with   Annual Exam    HPI  Patient presents for annual CPE .  Discussed the use of AI scribe software for clinical note transcription with the patient, who gave verbal consent to proceed.  History of Present Illness Harold Forbes is a 53 year old male who presents for an annual physical exam.  He receives routine care through the [facility] and is up to date with vaccinations, including pneumonia and COVID vaccines. He is interested in receiving the flu vaccine this year. He confirms receiving a tetanus booster after an injury in [place].  He uses a new, stronger inhaler prescribed by the [facility], which requires mouth rinsing after use, and albuterol, particularly during exercise due to breathing difficulties. The new inhaler is not covered by insurance and is expensive, but it is slightly more effective than albuterol.  His A1c has been in the prediabetic range during the last two visits, and he is due for routine lab work to monitor this. He had a colonoscopy in 2020 with a recommendation to return in ten years and is up to date with prostate cancer screening. He does not smoke and therefore does not require lung cancer screening.    IPSS     Row Name 06/13/24 1305         International Prostate Symptom Score   How often have you had the sensation of not emptying your bladder? Not at All     How often have you had to urinate less than every two hours? Not at All     How often have you found you stopped and started again several times when you urinated? Not at All     How often have you found it difficult to postpone urination? Not at All     How often have you had a weak urinary stream? Not at All     How often have you had to strain to start urination? Not at All     How many times did you typically get up at night to  urinate? 2 Times     Total IPSS Score 2       Quality of Life due to urinary symptoms   If you were to spend the rest of your life with your urinary condition just the way it is now how would you feel about that? Mostly Satisfied        Diet: Regular Exercise: 5 days 60 minutes Last Dental Exam: completed Last Eye Exam: completed  Depression: phq 9 is negative    06/13/2024    1:02 PM 01/11/2024    3:18 PM 06/09/2023    8:17 AM 03/10/2023    2:58 PM 10/20/2022    3:08 PM  Depression screen PHQ 2/9  Decreased Interest 0 0 0 0 0  Down, Depressed, Hopeless 0 0 0 0 0  PHQ - 2 Score 0 0 0 0 0  Altered sleeping   0 0 0  Tired, decreased energy   0 0 0  Change in appetite   0 0 0  Feeling bad or failure about yourself    0 0 0  Trouble concentrating   0 0 0  Moving slowly or fidgety/restless   0 0 0  Suicidal thoughts   0 0 0  PHQ-9 Score  0 0 0  Difficult doing work/chores   Not difficult at all Not difficult at all Not difficult at all    Hypertension:  BP Readings from Last 3 Encounters:  06/13/24 120/76  01/19/24 126/72  01/11/24 120/78    Obesity: Wt Readings from Last 3 Encounters:  06/13/24 196 lb 6.4 oz (89.1 kg)  01/19/24 197 lb 3.2 oz (89.4 kg)  01/11/24 196 lb 11.2 oz (89.2 kg)   BMI Readings from Last 3 Encounters:  06/13/24 29.86 kg/m  01/19/24 29.98 kg/m  01/11/24 29.91 kg/m     Flowsheet Row Office Visit from 06/13/2024 in [facility]  AUDIT-C Score 3     Single STD testing and prevention (HIV/chl/gon/syphilis):  no just had done a few weeks ago  Hep C Screening: completed Skin cancer: Discussed monitoring for atypical lesions Colorectal cancer: colonoscopy 06/27/2019, repeat in 10 years Prostate cancer:  yes Lab Results  Component Value Date   PSA 0.30 06/09/2023   PSA 0.38 06/05/2022   PSA 0.22 05/31/2021    Lung cancer:  Low Dose CT Chest recommended if Age 25-80 years, 30 pack-year currently smoking OR have  quit w/in 15years. Patient  is not a candidate for screening   AAA: The USPSTF recommends one-time screening with ultrasonography in men ages 93 to 75 years who have ever smoked. Patient   is not a candidate for screening     Vaccines: reviewed with the patient. Flu vaccine today.   Advanced Care Planning: A voluntary discussion about advance care planning including the explanation and discussion of advance directives.  Discussed health care proxy and Living will, and the patient was able to identify a health care proxy as  Harold Forbes (sister).  Patient does have a living will and power of attorney of health care   Patient Active Problem List   Diagnosis Date Noted   Moderate persistent asthma with acute exacerbation 01/19/2024   Pure hypercholesterolemia 05/29/2020   Anxiety disorder 02/07/2020   Acute stress reaction 02/07/2020   Insomnia 02/07/2020   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/07/2020   Encounter for screening colonoscopy    Preventative health care 01/29/2018   Gastroesophageal reflux disease 01/29/2018   Screening for STDs (sexually transmitted diseases) 11/19/2016   Right elbow tendonitis 11/19/2016   Annual physical exam 11/19/2015   Frequent urination 11/19/2015    Past Surgical History:  Procedure Laterality Date   COLONOSCOPY WITH PROPOFOL  N/A 06/27/2019   Procedure: COLONOSCOPY WITH PROPOFOL ;  Surgeon: Unk Corinn Skiff, MD;  Location: [facility] ENDOSCOPY;  Service: Gastroenterology;  Laterality: N/A;   LASIK Bilateral 2000   TONSILLECTOMY AND ADENOIDECTOMY      Family History  Problem Relation Age of Onset   Healthy Mother    Lung cancer Father    Cancer Father 72       lung   Prostate cancer Maternal Uncle     Social History   Socioeconomic History   Marital status: Single    Spouse name: Not on file   Number of children: 0   Years of education: 12   Highest education level: Master's degree (e.g., MA, MS, MEng, MEd, MSW, MBA)   Occupational History   Not on file  Tobacco Use   Smoking status: Never   Smokeless tobacco: Never  Vaping Use   Vaping status: Never Used  Substance and Sexual Activity   Alcohol use: Yes    Alcohol/week: 4.0 standard drinks of alcohol  Types: 4 Standard drinks or equivalent per week    Comment: occasional   Drug use: No   Sexual activity: Yes    Comment: multiple partners using condoms, male partners  Other Topics Concern   Not on file  Social History Narrative   Not on file   Social Drivers of Health   Financial Resource Strain: Low Risk  (06/09/2024)   Overall Financial Resource Strain (CARDIA)    Difficulty of Paying Living Expenses: Not hard at all  Food Insecurity: No Food Insecurity (06/09/2024)   Hunger Vital Sign    Worried About Running Out of Food in the Last Year: Never true    Ran Out of Food in the Last Year: Never true  Transportation Needs: No Transportation Needs (06/09/2024)   PRAPARE - Administrator, Civil Service (Medical): No    Lack of Transportation (Non-Medical): No  Physical Activity: Sufficiently Active (06/09/2024)   Exercise Vital Sign    Days of Exercise per Week: 5 days    Minutes of Exercise per Session: 40 min  Stress: No Stress Concern Present (06/09/2024)   [employer] of Occupational Health - Occupational Stress Questionnaire    Feeling of Stress: Only a little  Social Connections: Moderately Integrated (06/09/2024)   Social Connection and Isolation Panel    Frequency of Communication with Friends and Family: More than three times a week    Frequency of Social Gatherings with Friends and Family: Once a week    Attends Religious Services: 1 to 4 times per year    Active Member of [employer] or Organizations: Yes    Attends Banker Meetings: 1 to 4 times per year    Marital Status: Never married  Intimate Partner Violence: Not At Risk (06/05/2022)   Humiliation, Afraid, Rape, and Kick questionnaire    Fear of  Current or Ex-Partner: No    Emotionally Abused: No    Physically Abused: No    Sexually Abused: No     Current Outpatient Medications:    Albuterol-Budesonide (AIRSUPRA ) 90-80 MCG/ACT AERO, Inhale 1-2 puffs into the lungs as needed (wheezing)., Disp: 10.7 g, Rfl: 5   busPIRone  (BUSPAR ) 5 MG tablet, Take 1-3 tablets (5-15 mg total) by mouth 2 (two) times daily as needed., Disp: 120 tablet, Rfl: 0   cetirizine (ZYRTEC) 10 MG tablet, Take by mouth., Disp: , Rfl:    diclofenac  Sodium (VOLTAREN) 1 % GEL, Apply topically., Disp: , Rfl:    emtricitabine-tenofovir AF (DESCOVY) 200-25 MG tablet, Take by mouth., Disp: , Rfl:    fluticasone  (FLONASE ) 50 MCG/ACT nasal spray, SHAKE LIQUID AND USE 2 SPRAYS IN EACH NOSTRIL DAILY, Disp: 16 g, Rfl: 2   naproxen (NAPROSYN) 375 MG tablet, Take 375 mg by mouth 2 (two) times daily., Disp: , Rfl:    omeprazole  (PRILOSEC) 20 MG capsule, Take 1 capsule (20 mg total) by mouth daily., Disp: 90 capsule, Rfl: 1   tamsulosin  (FLOMAX ) 0.4 MG CAPS capsule, TAKE 1 CAPSULE(0.4 MG) BY MOUTH DAILY, Disp: 90 capsule, Rfl: 0   traZODone  (DESYREL ) 50 MG tablet, Take 1 tablet (50 mg total) by mouth at bedtime as needed for sleep., Disp: 90 tablet, Rfl: 1   benzonatate  (TESSALON ) 100 MG capsule, Take 2 capsules (200 mg total) by mouth 2 (two) times daily as needed for cough. (Patient not taking: Reported on 06/13/2024), Disp: 20 capsule, Rfl: 0   predniSONE  (STERAPRED UNI-PAK 21 TAB) 10 MG (21) TBPK tablet, Use as directed. (Patient not  taking: Reported on 06/13/2024), Disp: 21 each, Rfl: 0   promethazine -dextromethorphan (PROMETHAZINE -DM) 6.25-15 MG/5ML syrup, Take 5 mLs by mouth 4 (four) times daily as needed for cough. (Patient not taking: Reported on 06/13/2024), Disp: 118 mL, Rfl: 0  No Known Allergies   Review of Systems  All other systems reviewed and are negative.   Objective  Vitals:   06/13/24 1311  BP: 120/76  Pulse: 87  Resp: 16  Temp: 98.3 F (36.8 C)   TempSrc: Oral  SpO2: 97%  Weight: 196 lb 6.4 oz (89.1 kg)  Height: 5' 8 (1.727 m)    Body mass index is 29.86 kg/m.  Physical Exam Constitutional:      Appearance: Normal appearance.  HENT:     Head: Normocephalic and atraumatic.     Mouth/Throat:     Mouth: Mucous membranes are moist.     Pharynx: Oropharynx is clear.  Eyes:     Extraocular Movements: Extraocular movements intact.     Conjunctiva/sclera: Conjunctivae normal.     Pupils: Pupils are equal, round, and reactive to light.  Neck:     Comments: No thyromegaly Cardiovascular:     Rate and Rhythm: Normal rate and regular rhythm.  Pulmonary:     Effort: Pulmonary effort is normal.     Breath sounds: Normal breath sounds.  Musculoskeletal:     Cervical back: No tenderness.     Right lower leg: No edema.     Left lower leg: No edema.  Lymphadenopathy:     Cervical: No cervical adenopathy.  Skin:    General: Skin is warm and dry.  Neurological:     General: No focal deficit present.     Mental Status: He is alert. Mental status is at baseline.  Psychiatric:        Mood and Affect: Mood normal.        Behavior: Behavior normal.      Last CBC Lab Results  Component Value Date   WBC 7.4 06/09/2023   HGB 13.0 (L) 06/09/2023   HCT 41.6 06/09/2023   MCV 85.4 06/09/2023   MCH 26.7 (L) 06/09/2023   RDW 13.4 06/09/2023   PLT 305 06/09/2023   Last metabolic panel Lab Results  Component Value Date   GLUCOSE 105 (H) 06/09/2023   NA 138 06/09/2023   K 4.4 06/09/2023   CL 104 06/09/2023   CO2 26 06/09/2023   BUN 15 06/09/2023   CREATININE 1.27 06/09/2023   EGFR 67 06/09/2023   CALCIUM 9.4 06/09/2023   PROT 6.8 06/09/2023   ALBUMIN 4.3 11/19/2016   LABGLOB 2.1 11/22/2015   AGRATIO 2.1 11/22/2015   BILITOT 1.4 (H) 06/09/2023   ALKPHOS 51 11/19/2016   AST 23 06/09/2023   ALT 26 06/09/2023   Last lipids Lab Results  Component Value Date   CHOL 168 06/09/2023   HDL 56 06/09/2023   LDLCALC 95  06/09/2023   TRIG 81 06/09/2023   CHOLHDL 3.0 06/09/2023   Last hemoglobin A1c Lab Results  Component Value Date   HGBA1C 5.9 (H) 06/09/2023   Last thyroid functions Lab Results  Component Value Date   TSH 0.72 05/31/2021   Last vitamin D  Lab Results  Component Value Date   VD25OH 33 11/19/2016   Last vitamin B12 and Folate No results found for: VITAMINB12, FOLATE    Assessment & Plan  Assessment & Plan Adult Wellness Visit Routine wellness visit with normal vitals. Immunizations current. Discussed and agreed on flu and COVID  vaccines, explaining benefits and strain matching. - Administer flu vaccine. - Send prescription for COVID vaccine to [facility] in [place]. - Order routine labs: kidney, liver, electrolytes, cholesterol, prostate cancer screening, and A1c.  Prediabetes Prediabetes with previous A1c in prediabetic range, requiring monitoring. - Order A1c test to monitor glucose levels.  Asthma Asthma managed with inhalers. Recently switched to stronger maintenance inhaler. Albuterol used as needed. Discussed insurance issues for [employer] inhaler. Slight improvement with Air Supra noted but not cost-effective. - Send refill for albuterol inhaler.  - CBC w/Diff/Platelet - Comprehensive Metabolic Panel (CMET) - albuterol (VENTOLIN HFA) 108 (90 Base) MCG/ACT inhaler; Inhale 2 puffs into the lungs every 6 (six) hours as needed for wheezing or shortness of breath.  Dispense: 8 g; Refill: 0 - COVID-19 mRNA vaccine, Pfizer, (COMIRNATY) syringe; Inject 0.3 mLs into the muscle once for 1 dose.  Dispense: 0.3 mL; Refill: 0 - Lipid Profile - PSA - HgB A1c   -Prostate cancer screening and PSA options (with potential risks and benefits of testing vs not testing) were discussed along with recent recs/guidelines. -USPSTF grade A and B recommendations reviewed with patient; age-appropriate recommendations, preventive care, screening tests, etc discussed and encouraged;  healthy living encouraged; see AVS for patient education given to patient -Discussed importance of 150 minutes of physical activity weekly, eat two servings of fish weekly, eat one serving of tree nuts ( cashews, pistachios, pecans, almonds.SABRA) every other day, eat 6 servings of fruit/vegetables daily and drink plenty of water and avoid sweet beverages.  -Reviewed Health Maintenance: yes

## 2024-06-14 ENCOUNTER — Ambulatory Visit: Payer: Self-pay | Admitting: Internal Medicine

## 2024-06-14 ENCOUNTER — Encounter: Admitting: Internal Medicine

## 2024-06-14 LAB — HEMOGLOBIN A1C
Hgb A1c MFr Bld: 5.7 % — ABNORMAL HIGH (ref <5.7)
Mean Plasma Glucose: 117 mg/dL
eAG (mmol/L): 6.5 mmol/L

## 2024-06-14 LAB — LIPID PANEL
Cholesterol: 161 mg/dL (ref <200)
HDL: 52 mg/dL (ref >=40)
LDL Cholesterol (Calc): 90 mg/dL
Non-HDL Cholesterol (Calc): 109 mg/dL (ref <130)
Total CHOL/HDL Ratio: 3.1 (calc) (ref <5.0)
Triglycerides: 95 mg/dL (ref <150)

## 2024-06-14 LAB — COMPREHENSIVE METABOLIC PANEL WITH GFR
AG Ratio: 2.2 (calc) (ref 1.0–2.5)
ALT: 30 U/L (ref 9–46)
AST: 24 U/L (ref 10–35)
Albumin: 4.7 g/dL (ref 3.6–5.1)
Alkaline phosphatase (APISO): 49 U/L (ref 35–144)
BUN/Creatinine Ratio: 12 (calc) (ref 6–22)
BUN: 16 mg/dL (ref 7–25)
CO2: 27 mmol/L (ref 20–32)
Calcium: 9.5 mg/dL (ref 8.6–10.3)
Chloride: 101 mmol/L (ref 98–110)
Creat: 1.35 mg/dL — ABNORMAL HIGH (ref 0.70–1.30)
Globulin: 2.1 g/dL (ref 1.9–3.7)
Glucose, Bld: 95 mg/dL (ref 65–99)
Potassium: 4.4 mmol/L (ref 3.5–5.3)
Sodium: 137 mmol/L (ref 135–146)
Total Bilirubin: 1.3 mg/dL — ABNORMAL HIGH (ref 0.2–1.2)
Total Protein: 6.8 g/dL (ref 6.1–8.1)
eGFR: 62 mL/min/1.73m2 (ref >=60)

## 2024-06-14 LAB — CBC WITH DIFFERENTIAL/PLATELET
Absolute Lymphocytes: 1646 {cells}/uL (ref 850–3900)
Absolute Monocytes: 617 {cells}/uL (ref 200–950)
Basophils Absolute: 49 {cells}/uL (ref 0–200)
Basophils Relative: 0.5 %
Eosinophils Absolute: 186 {cells}/uL (ref 15–500)
Eosinophils Relative: 1.9 %
HCT: 43.6 % (ref 38.5–50.0)
Hemoglobin: 14.4 g/dL (ref 13.2–17.1)
MCH: 28.5 pg (ref 27.0–33.0)
MCHC: 33 g/dL (ref 32.0–36.0)
MCV: 86.3 fL (ref 80.0–100.0)
MPV: 11 fL (ref 7.5–12.5)
Monocytes Relative: 6.3 %
Neutro Abs: 7301 {cells}/uL (ref 1500–7800)
Neutrophils Relative %: 74.5 %
Platelets: 292 Thousand/uL (ref 140–400)
RBC: 5.05 Million/uL (ref 4.20–5.80)
RDW: 13.5 % (ref 11.0–15.0)
Total Lymphocyte: 16.8 %
WBC: 9.8 Thousand/uL (ref 3.8–10.8)

## 2024-06-14 LAB — PSA: PSA: 0.3 ng/mL (ref <=4.00)

## 2024-08-09 ENCOUNTER — Other Ambulatory Visit: Payer: Self-pay | Admitting: Internal Medicine

## 2024-08-09 ENCOUNTER — Encounter: Payer: Self-pay | Admitting: Internal Medicine

## 2024-08-09 DIAGNOSIS — J4540 Moderate persistent asthma, uncomplicated: Secondary | ICD-10-CM

## 2024-08-09 MED ORDER — FLUTICASONE-SALMETEROL 100-50 MCG/ACT IN AEPB: 1.0000 | INHALATION_SPRAY | Freq: Two times a day (BID) | RESPIRATORY_TRACT | 5 refills | Status: DC

## 2024-09-05 ENCOUNTER — Ambulatory Visit: Payer: Self-pay

## 2024-09-05 DIAGNOSIS — N401 Enlarged prostate with lower urinary tract symptoms: Secondary | ICD-10-CM

## 2024-09-05 MED ORDER — TAMSULOSIN HCL 0.4 MG PO CAPS: 0.4000 mg | ORAL_CAPSULE | Freq: Every day | ORAL | 0 refills | Status: AC

## 2024-09-05 NOTE — Telephone Encounter (Signed)
 FYI Only or Action Required?: FYI only for provider: appointment scheduled on tomorrow morning.  Patient was last seen in primary care on 06/13/2024 by Bernardo Fend, DO.  Called Nurse Triage reporting Cough. Mild SOB  Symptoms began several days ago.  Interventions attempted: Prescription medications: inhaler.  Symptoms are: unchanged.  Triage Disposition: See Physician Within 24 Hours (overriding See PCP Within 2 Weeks)  Patient/caregiver understands and will follow disposition?: Yes                 Copied from CRM [id number]. Topic: Clinical - Red Word Triage >> Sep 05, 2024  5:13 PM Hadassah PARAS wrote: Red Word that prompted transfer to Nurse Triage:  Pt is experiencing shortness of breathe, cough, cold like symtpoms. Pt has asthma. Transferred to [facility] Reason for Disposition  [1] MILD longstanding difficulty breathing (e.g., minimal/no SOB at rest, SOB with walking, pulse < 100) AND [2] SAME as normal  Answer Assessment - Initial Assessment Questions 1. RESPIRATORY STATUS: Describe your breathing? (e.g., wheezing, shortness of breath, unable to speak, severe coughing)      Cough & wheezing 2. ONSET: When did this breathing problem begin?      Friday  - Saturday 3. PATTERN Does the difficult breathing come and go, or has it been constant since it started?      Comes and goes 4. SEVERITY: How bad is your breathing? (e.g., mild, moderate, severe)      mild 5. RECURRENT SYMPTOM: Have you had difficulty breathing before? If Yes, ask: When was the last time? and What happened that time?      Yes 6. CARDIAC HISTORY: Do you have any history of heart disease? (e.g., heart attack, angina, bypass surgery, angioplasty)      no 7. LUNG HISTORY: Do you have any history of lung disease?  (e.g., pulmonary embolus, asthma, emphysema)     Asthma 8. CAUSE: What do you think is causing the breathing problem?      bronchitis 9. OTHER SYMPTOMS: Do you have any  other symptoms? (e.g., chest pain, cough, dizziness, fever, runny nose)     no  Protocols used: Breathing Difficulty-A-AH

## 2024-09-06 ENCOUNTER — Encounter: Payer: Self-pay | Admitting: Primary Care

## 2024-09-06 ENCOUNTER — Telehealth (INDEPENDENT_AMBULATORY_CARE_PROVIDER_SITE_OTHER): Admitting: Primary Care

## 2024-09-06 VITALS — Ht 69.0 in | Wt 190.0 lb

## 2024-09-06 DIAGNOSIS — R051 Acute cough: Secondary | ICD-10-CM

## 2024-09-06 MED ORDER — PREDNISONE 20 MG PO TABS: ORAL_TABLET | ORAL | 0 refills | Status: AC

## 2024-09-06 MED ORDER — BENZONATATE 200 MG PO CAPS: 200.0000 mg | ORAL_CAPSULE | Freq: Three times a day (TID) | ORAL | 0 refills | Status: AC | PRN

## 2024-09-06 NOTE — Progress Notes (Signed)
 "   Patient ID: Harold Forbes, male    DOB: 1970-10-11, 54 y.o.   MRN: [id number]  Virtual visit completed through [employer], a video enabled telemedicine application. Due to national recommendations of social distancing due to COVID-19, a virtual visit is felt to be most appropriate for this patient at this time. Reviewed limitations, risks, security and privacy concerns of performing a virtual visit and the availability of in person appointments. I also reviewed that there may be a patient responsible charge related to this service. The patient agreed to proceed.   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    Ht 5' 9 (1.753 m)   Wt 190 lb (86.2 kg)   BMI 28.06 kg/m    CC: Acute Cough Subjective:   HPI: Harold Forbes is a 54 y.o. male patient of Dr. Bernardo with a history of moderate persistent asthma, GERD presenting on 09/06/2024 for Cough (C/o cough, SOB and HA. Sxs started 09/02/24.)  Symptom onset 4 days ago with chest tightness. He then developed a cough, shortness of breath, wheezing.   He's using his albuterol  inhaler PRN, Mucinex DM with improvement. He denies fevers, chills, body aches.   Prior to symptom onset he was traveling during [place].       Relevant past medical, surgical, family and social history reviewed and updated as indicated. Interim medical history since our last visit reviewed. Allergies and medications reviewed and updated. Outpatient Medications Prior to Visit  Medication Sig Dispense Refill   busPIRone  (BUSPAR ) 5 MG tablet Take 1-3 tablets (5-15 mg total) by mouth 2 (two) times daily as needed. 120 tablet 0   cetirizine (ZYRTEC) 10 MG tablet Take by mouth.     diclofenac  Sodium (VOLTAREN) 1 % GEL Apply topically.     emtricitabine-tenofovir AF (DESCOVY)  200-25 MG tablet Take by mouth.     fluticasone  (FLONASE ) 50 MCG/ACT nasal spray SHAKE LIQUID AND USE 2 SPRAYS IN EACH NOSTRIL DAILY 16 g 2   fluticasone -salmeterol (ADVAIR HFA) 230-21 MCG/ACT inhaler Inhale 2 puffs into the lungs 2 (two) times daily.     naproxen (NAPROSYN) 375 MG tablet Take 375 mg by mouth 2 (two) times daily.     omeprazole  (PRILOSEC) 20 MG capsule Take 1 capsule (20 mg total) by mouth daily. 90 capsule 1   tamsulosin  (FLOMAX ) 0.4 MG CAPS capsule Take 1 capsule (0.4 mg total) by mouth daily. 90 capsule 0   traZODone  (DESYREL ) 50 MG tablet Take 1 tablet (50 mg total) by mouth at bedtime as needed for sleep. 90 tablet 1   No facility-administered medications prior to visit.     Per HPI unless specifically indicated in ROS section below Review of Systems  Constitutional:  Negative for chills and fever.  HENT:  Negative for sore throat.   Respiratory:  Positive for cough, chest tightness, shortness of breath and wheezing.   Musculoskeletal:  Negative for myalgias.   Objective:  Ht 5' 9 (1.753 m)   Wt 190 lb (86.2 kg)   BMI 28.06 kg/m   Wt Readings from Last 3 Encounters:  09/06/24 190 lb (86.2 kg)  06/13/24 196 lb 6.4 oz (89.1 kg)  01/19/24 197 lb 3.2 oz (89.4 kg)       Physical exam: General: Alert and oriented x 3, no distress, does not appear sickly  Pulmonary: Speaks in complete sentences without increased work of breathing, no cough during visit.  Psychiatric: Normal mood, thought content, and behavior.     Results for orders placed or performed in visit on 06/13/24  CBC w/Diff/Platelet   Collection Time: 06/13/24  1:37 PM  Result Value Ref Range   WBC 9.8 3.8 - 10.8 Thousand/uL   RBC 5.05 4.20 - 5.80 Million/uL   Hemoglobin 14.4 13.2 - 17.1 g/dL   HCT 56.3 61.4 - 49.9 %   MCV 86.3 80.0 - 100.0 fL   MCH 28.5 27.0 - 33.0 pg   MCHC 33.0 32.0 - 36.0 g/dL   RDW 86.4 88.9 - 84.9 %   Platelets 292 140 - 400 Thousand/uL   MPV 11.0 7.5 - 12.5 fL    Neutro Abs 7,301 1,500 - 7,800 cells/uL   Absolute Lymphocytes 1,646 850 - 3,900 cells/uL   Absolute Monocytes 617 200 - 950 cells/uL   Eosinophils Absolute 186 15 - 500 cells/uL   Basophils Absolute 49 0 - 200 cells/uL   Neutrophils Relative % 74.5 %   Total Lymphocyte 16.8 %   Monocytes Relative 6.3 %   Eosinophils Relative 1.9 %   Basophils Relative 0.5 %  Comprehensive Metabolic Panel (CMET)   Collection Time: 06/13/24  1:37 PM  Result Value Ref Range   Glucose, Bld 95 65 - 99 mg/dL   BUN 16 7 - 25 mg/dL   Creat 8.64 (H) 9.29 - 1.30 mg/dL   eGFR 62 > OR = 60 fO/fpw/8.26f7   BUN/Creatinine Ratio 12 6 - 22 (calc)   Sodium 137 135 - 146 mmol/L   Potassium 4.4 3.5 - 5.3 mmol/L   Chloride 101 98 - 110 mmol/L   CO2 27 20 - 32 mmol/L   Calcium 9.5 8.6 - 10.3 mg/dL   Total Protein 6.8 6.1 - 8.1 g/dL   Albumin 4.7 3.6 - 5.1 g/dL   Globulin 2.1 1.9 - 3.7 g/dL (calc)   AG Ratio 2.2 1.0 - 2.5 (calc)   Total Bilirubin 1.3 (H) 0.2 - 1.2 mg/dL   Alkaline phosphatase (APISO) 49 35 - 144 U/L   AST 24 10 - 35 U/L   ALT 30 9 - 46 U/L  Lipid Profile   Collection Time: 06/13/24  1:37 PM  Result Value Ref Range   Cholesterol 161 <200 mg/dL   HDL 52 > OR = 40 mg/dL   Triglycerides 95 <849 mg/dL   LDL Cholesterol (Calc) 90 mg/dL (calc)   Total CHOL/HDL Ratio 3.1 <5.0 (calc)   Non-HDL Cholesterol (Calc) 109 <130 mg/dL (calc)  PSA   Collection Time: 06/13/24  1:37 PM  Result Value Ref Range   PSA 0.30 < OR = 4.00 ng/mL  HgB A1c   Collection Time: 06/13/24  1:37 PM  Result Value Ref Range   Hgb A1c MFr Bld 5.7 (H) <5.7 %   Mean Plasma Glucose 117 mg/dL   eAG (mmol/L) 6.5 mmol/L   Assessment & Plan:   Problem List Items Addressed This Visit       Other   Acute cough - Primary   Symptoms representative of viral etiology at this point. Will also assume mild asthma exacerbation based on symptoms.  Fortunately, he appears well overall.  Start prednisone  20 mg tablets. Take 2  tablets by mouth once daily in the morning for 5 days. Start Benzonatate  capsules for cough. Take 1 capsule by mouth three times daily as needed for cough. Continue albuterol  inhaler as needed.  He was instructed to contact us  Friday this week if he is starting to feel worse.      Relevant Medications   benzonatate  (TESSALON ) 200 MG capsule   predniSONE  (DELTASONE ) 20 MG tablet     Meds ordered this encounter  Medications   benzonatate  (TESSALON ) 200 MG capsule    Sig: Take 1 capsule (200 mg total) by mouth 3 (three) times daily as needed for cough.    Dispense:  15 capsule    Refill:  0    Supervising Provider:   BEDSOLE, AMY E [[id number]]   predniSONE  (DELTASONE ) 20 MG tablet    Sig: Take 2 tablets by mouth once daily in the morning for 5 days.    Dispense:  10 tablet    Refill:  0    Supervising Provider:   BEDSOLE, AMY E [[id number]]   No orders of the defined types were placed in this encounter.   I discussed the assessment and treatment plan with the patient. The patient was provided an opportunity to ask questions and all were answered. The patient agreed with the plan and demonstrated an understanding of the instructions. The patient was advised to call back or seek an in-person evaluation if the symptoms worsen or if the condition fails to improve as anticipated.  Follow up plan:  Start Benzonatate  capsules for cough. Take 1 capsule by mouth three times daily as needed for cough.  Start prednisone  20 mg tablets. Take 2 tablets by mouth once daily in the morning for 5 days.  Please update me via MyChart if you are beginning to feel worse on Friday this week.  It was a pleasure meeting you!   Karia Ehresman K Thorsten Climer, NP   "

## 2024-09-06 NOTE — Assessment & Plan Note (Signed)
 Symptoms representative of viral etiology at this point. Will also assume mild asthma exacerbation based on symptoms.  Fortunately, he appears well overall.  Start prednisone  20 mg tablets. Take 2 tablets by mouth once daily in the morning for 5 days. Start Benzonatate  capsules for cough. Take 1 capsule by mouth three times daily as needed for cough. Continue albuterol  inhaler as needed.  He was instructed to contact us  Friday this week if he is starting to feel worse.

## 2024-09-06 NOTE — Patient Instructions (Signed)
 Start Benzonatate  capsules for cough. Take 1 capsule by mouth three times daily as needed for cough.  Start prednisone  20 mg tablets. Take 2 tablets by mouth once daily in the morning for 5 days.  Please update me via MyChart if you are beginning to feel worse on Friday this week.  It was a pleasure meeting you!

## 2024-09-12 ENCOUNTER — Encounter: Payer: Self-pay | Admitting: Nurse Practitioner

## 2024-09-12 ENCOUNTER — Telehealth: Admitting: Nurse Practitioner

## 2024-09-12 DIAGNOSIS — J4541 Moderate persistent asthma with (acute) exacerbation: Secondary | ICD-10-CM | POA: Diagnosis not present

## 2024-09-12 DIAGNOSIS — R051 Acute cough: Secondary | ICD-10-CM

## 2024-09-12 DIAGNOSIS — R053 Chronic cough: Secondary | ICD-10-CM | POA: Diagnosis not present

## 2024-09-12 MED ORDER — PROMETHAZINE-DM 6.25-15 MG/5ML PO SYRP: 5.0000 mL | ORAL_SOLUTION | Freq: Four times a day (QID) | ORAL | 0 refills | Status: AC | PRN

## 2024-09-12 MED ORDER — AZITHROMYCIN 250 MG PO TABS: ORAL_TABLET | ORAL | 0 refills | Status: AC

## 2024-09-12 NOTE — Progress Notes (Signed)
 "  Name: Harold Forbes   MRN: [id number]    DOB: 1970-12-21   Date:09/12/2024       Progress Note  Subjective  Chief Complaint  Chief Complaint  Patient presents with   Cough    I connected with  Aleene Morrow  on 09/12/2024 at  1:20 PM EST by a video enabled telemedicine application and verified that I am speaking with the correct person using two identifiers.  I discussed the limitations of evaluation and management by telemedicine and the availability of in person appointments. The patient expressed understanding and agreed to proceed with a virtual visit  Staff also discussed with the patient that there may be a patient responsible charge related to this service. Patient Location: work Dispensing Optician: [facility] Additional Individuals present: alone  HPI   Discussed the use of AI scribe software for clinical note transcription with the patient, who gave verbal consent to proceed.  History of Present Illness Harold Forbes is a 54 year old male with asthma who presents with a persistent cough and shortness of breath.  Respiratory symptoms - Persistent cough and shortness of breath for the past week - Initial chest tightness improved significantly after prednisone  - Cough and shortness of breath persist, especially with exertion - No fever  Asthma management and medication response - Uses Advair and Wixela inhalers; inhalers provide only temporary relief and are less effective than usual - Uses albuterol  as needed - Treated with prednisone  with improvement in chest tightness - Uses over-the-counter medications including Mucinex DM and Robitussin DM - Prescribed Tessalon  Perles and prednisone  during virtual appointment on September 06, 2024  Allergies - No known allergies    Patient Active Problem List   Diagnosis Date Noted   Acute cough 09/06/2024   Moderate persistent asthma with acute exacerbation 01/19/2024   Pure hypercholesterolemia 05/29/2020   Anxiety disorder  02/07/2020   Acute stress reaction 02/07/2020   Insomnia 02/07/2020   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/07/2020   Encounter for screening colonoscopy    Preventative health care 01/29/2018   Gastroesophageal reflux disease 01/29/2018   Screening for STDs (sexually transmitted diseases) 11/19/2016   Right elbow tendonitis 11/19/2016   Annual physical exam 11/19/2015   Frequent urination 11/19/2015    Social History   Tobacco Use   Smoking status: Never   Smokeless tobacco: Never  Substance Use Topics   Alcohol use: Yes    Alcohol/week: 4.0 standard drinks of alcohol    Types: 4 Standard drinks or equivalent per week    Comment: occasional    Current Medications[1]  Allergies[2]  I personally reviewed active problem list, medication list, allergies, notes from last encounter with the patient/caregiver today.  ROS  Ten systems reviewed and is negative except as mentioned in HPI   Objective  Virtual encounter, vitals not obtained.  There is no height or weight on file to calculate BMI.  Nursing Note and Vital Signs reviewed.  Physical Exam  Awake, alert and oriented, speaking in complete sentences   No results found for this or any previous visit (from the past 72 hours).  Assessment & Plan  Assessment and Plan Assessment & Plan Moderate persistent asthma with acute exacerbation Asthma exacerbation with chest tightness, persistent cough, and exertional dyspnea. Previous prednisone  treatment provided significant relief. Current inhalers (Wixela and Advair) offer temporary relief. Suspected bacterial infection contributing to symptoms. - Prescribed azithromycin  for suspected bacterial infection. - Advised to monitor symptoms and report if no improvement  in 48 hours for potential chest x-ray. - Prescribed cough syrup, to be taken up to four times a day, with caution regarding drowsiness. - Confirmed no allergies to medications.      -Red  flags and when to present for emergency care or RTC including fever >101.19F, chest pain, shortness of breath, new/worsening/un-resolving symptoms,  reviewed with patient at time of visit. Follow up and care instructions discussed and provided in AVS. - I discussed the assessment and treatment plan with the patient. The patient was provided an opportunity to ask questions and all were answered. The patient agreed with the plan and demonstrated an understanding of the instructions.  I provided 15 minutes of non-face-to-face time during this encounter.  Kaeley Vinje F Atticus Wedin, FNP      [1]  Current Outpatient Medications:    azithromycin  (ZITHROMAX ) 250 MG tablet, Take 2 tablets on day 1, then 1 tablet daily on days 2 through 5, Disp: 6 tablet, Rfl: 0   benzonatate  (TESSALON ) 200 MG capsule, Take 1 capsule (200 mg total) by mouth 3 (three) times daily as needed for cough., Disp: 15 capsule, Rfl: 0   busPIRone  (BUSPAR ) 5 MG tablet, Take 1-3 tablets (5-15 mg total) by mouth 2 (two) times daily as needed., Disp: 120 tablet, Rfl: 0   cetirizine (ZYRTEC) 10 MG tablet, Take by mouth., Disp: , Rfl:    diclofenac  Sodium (VOLTAREN) 1 % GEL, Apply topically., Disp: , Rfl:    emtricitabine-tenofovir AF (DESCOVY) 200-25 MG tablet, Take by mouth., Disp: , Rfl:    fluticasone  (FLONASE ) 50 MCG/ACT nasal spray, SHAKE LIQUID AND USE 2 SPRAYS IN EACH NOSTRIL DAILY, Disp: 16 g, Rfl: 2   fluticasone -salmeterol (ADVAIR HFA) 230-21 MCG/ACT inhaler, Inhale 2 puffs into the lungs 2 (two) times daily., Disp: , Rfl:    naproxen (NAPROSYN) 375 MG tablet, Take 375 mg by mouth 2 (two) times daily., Disp: , Rfl:    omeprazole  (PRILOSEC) 20 MG capsule, Take 1 capsule (20 mg total) by mouth daily., Disp: 90 capsule, Rfl: 1   predniSONE  (DELTASONE ) 20 MG tablet, Take 2 tablets by mouth once daily in the morning for 5 days., Disp: 10 tablet, Rfl: 0   promethazine -dextromethorphan (PROMETHAZINE -DM) 6.25-15 MG/5ML syrup, Take 5 mLs by  mouth 4 (four) times daily as needed for cough., Disp: 118 mL, Rfl: 0   tamsulosin  (FLOMAX ) 0.4 MG CAPS capsule, Take 1 capsule (0.4 mg total) by mouth daily., Disp: 90 capsule, Rfl: 0   traZODone  (DESYREL ) 50 MG tablet, Take 1 tablet (50 mg total) by mouth at bedtime as needed for sleep., Disp: 90 tablet, Rfl: 1 [2] No Known Allergies  "

## 2025-06-15 ENCOUNTER — Encounter: Admitting: Internal Medicine
